# Patient Record
Sex: Male | Born: 1950 | Race: Black or African American | Hispanic: No | Smoking: Former smoker
Health system: Southern US, Community
[De-identification: ages and names within clinical notes are randomized; demographics above are authoritative.]

## PROBLEM LIST (undated history)

## (undated) DIAGNOSIS — M549 Dorsalgia, unspecified: Secondary | ICD-10-CM

## (undated) DIAGNOSIS — G8929 Other chronic pain: Secondary | ICD-10-CM

## (undated) DIAGNOSIS — E785 Hyperlipidemia, unspecified: Secondary | ICD-10-CM

## (undated) HISTORY — DX: Hyperlipidemia, unspecified: E78.5

## (undated) HISTORY — DX: Dorsalgia, unspecified: M54.9

## (undated) HISTORY — DX: Other chronic pain: G89.29

## (undated) HISTORY — PX: OTHER SURGICAL HISTORY: SHX169

---

## 2001-03-05 ENCOUNTER — Emergency Department (HOSPITAL_COMMUNITY): Admission: EM | Admit: 2001-03-05 | Discharge: 2001-03-05 | Payer: Self-pay | Admitting: *Deleted

## 2001-03-23 ENCOUNTER — Encounter: Payer: Self-pay | Admitting: *Deleted

## 2001-03-23 ENCOUNTER — Emergency Department (HOSPITAL_COMMUNITY): Admission: EM | Admit: 2001-03-23 | Discharge: 2001-03-23 | Payer: Self-pay | Admitting: *Deleted

## 2002-01-21 ENCOUNTER — Emergency Department (HOSPITAL_COMMUNITY): Admission: EM | Admit: 2002-01-21 | Discharge: 2002-01-21 | Payer: Self-pay | Admitting: Emergency Medicine

## 2003-08-08 ENCOUNTER — Emergency Department (HOSPITAL_COMMUNITY): Admission: EM | Admit: 2003-08-08 | Discharge: 2003-08-08 | Payer: Self-pay | Admitting: Emergency Medicine

## 2003-09-08 ENCOUNTER — Emergency Department (HOSPITAL_COMMUNITY): Admission: EM | Admit: 2003-09-08 | Discharge: 2003-09-08 | Payer: Self-pay | Admitting: Emergency Medicine

## 2003-10-10 ENCOUNTER — Emergency Department (HOSPITAL_COMMUNITY): Admission: EM | Admit: 2003-10-10 | Discharge: 2003-10-10 | Payer: Self-pay | Admitting: Emergency Medicine

## 2005-02-10 ENCOUNTER — Emergency Department (HOSPITAL_COMMUNITY): Admission: EM | Admit: 2005-02-10 | Discharge: 2005-02-10 | Payer: Self-pay | Admitting: Emergency Medicine

## 2005-06-11 ENCOUNTER — Emergency Department: Payer: Self-pay | Admitting: Emergency Medicine

## 2005-06-15 ENCOUNTER — Ambulatory Visit: Payer: Self-pay | Admitting: Family Medicine

## 2005-07-26 ENCOUNTER — Emergency Department: Payer: Self-pay | Admitting: Emergency Medicine

## 2005-08-01 ENCOUNTER — Emergency Department: Payer: Self-pay | Admitting: Emergency Medicine

## 2005-09-01 ENCOUNTER — Ambulatory Visit: Payer: Self-pay | Admitting: Family Medicine

## 2005-09-04 ENCOUNTER — Ambulatory Visit (HOSPITAL_COMMUNITY): Admission: RE | Admit: 2005-09-04 | Discharge: 2005-09-04 | Payer: Self-pay | Admitting: Family Medicine

## 2006-01-11 ENCOUNTER — Emergency Department (HOSPITAL_COMMUNITY): Admission: EM | Admit: 2006-01-11 | Discharge: 2006-01-11 | Payer: Self-pay | Admitting: Emergency Medicine

## 2006-01-16 ENCOUNTER — Emergency Department (HOSPITAL_COMMUNITY): Admission: EM | Admit: 2006-01-16 | Discharge: 2006-01-16 | Payer: Self-pay | Admitting: Emergency Medicine

## 2006-10-23 ENCOUNTER — Emergency Department (HOSPITAL_COMMUNITY): Admission: EM | Admit: 2006-10-23 | Discharge: 2006-10-23 | Payer: Self-pay | Admitting: Emergency Medicine

## 2006-10-24 ENCOUNTER — Emergency Department (HOSPITAL_COMMUNITY): Admission: EM | Admit: 2006-10-24 | Discharge: 2006-10-24 | Payer: Self-pay | Admitting: Emergency Medicine

## 2007-01-18 ENCOUNTER — Emergency Department (HOSPITAL_COMMUNITY): Admission: EM | Admit: 2007-01-18 | Discharge: 2007-01-18 | Payer: Self-pay | Admitting: Emergency Medicine

## 2007-02-17 ENCOUNTER — Emergency Department (HOSPITAL_COMMUNITY): Admission: EM | Admit: 2007-02-17 | Discharge: 2007-02-17 | Payer: Self-pay | Admitting: Emergency Medicine

## 2007-02-20 ENCOUNTER — Emergency Department (HOSPITAL_COMMUNITY): Admission: EM | Admit: 2007-02-20 | Discharge: 2007-02-20 | Payer: Self-pay | Admitting: Emergency Medicine

## 2007-05-05 ENCOUNTER — Emergency Department (HOSPITAL_COMMUNITY): Admission: EM | Admit: 2007-05-05 | Discharge: 2007-05-05 | Payer: Self-pay | Admitting: Emergency Medicine

## 2007-07-11 ENCOUNTER — Encounter: Payer: Self-pay | Admitting: Family Medicine

## 2007-08-16 ENCOUNTER — Emergency Department (HOSPITAL_COMMUNITY): Admission: EM | Admit: 2007-08-16 | Discharge: 2007-08-16 | Payer: Self-pay | Admitting: Emergency Medicine

## 2007-09-04 ENCOUNTER — Emergency Department (HOSPITAL_COMMUNITY): Admission: EM | Admit: 2007-09-04 | Discharge: 2007-09-04 | Payer: Self-pay | Admitting: Emergency Medicine

## 2008-01-06 ENCOUNTER — Emergency Department (HOSPITAL_COMMUNITY): Admission: EM | Admit: 2008-01-06 | Discharge: 2008-01-06 | Payer: Self-pay | Admitting: Emergency Medicine

## 2008-04-24 ENCOUNTER — Emergency Department (HOSPITAL_COMMUNITY): Admission: EM | Admit: 2008-04-24 | Discharge: 2008-04-24 | Payer: Self-pay | Admitting: Emergency Medicine

## 2009-05-05 ENCOUNTER — Encounter: Payer: Self-pay | Admitting: Internal Medicine

## 2009-05-13 ENCOUNTER — Emergency Department (HOSPITAL_COMMUNITY): Admission: EM | Admit: 2009-05-13 | Discharge: 2009-05-13 | Payer: Self-pay | Admitting: Emergency Medicine

## 2009-06-17 ENCOUNTER — Encounter: Payer: Self-pay | Admitting: Orthopedic Surgery

## 2009-10-20 ENCOUNTER — Encounter: Admission: RE | Admit: 2009-10-20 | Discharge: 2009-10-20 | Payer: Self-pay | Admitting: Orthopedic Surgery

## 2010-07-30 ENCOUNTER — Encounter: Payer: Self-pay | Admitting: Family Medicine

## 2010-07-31 ENCOUNTER — Encounter: Payer: Self-pay | Admitting: Orthopedic Surgery

## 2010-08-09 NOTE — Letter (Signed)
Summary: Historic Patient File  Historic Patient File   Imported By: Lind Guest 04/06/2010 11:16:20  _____________________________________________________________________  External Attachment:    Type:   Image     Comment:   External Document

## 2010-09-07 ENCOUNTER — Emergency Department (HOSPITAL_COMMUNITY)
Admission: EM | Admit: 2010-09-07 | Discharge: 2010-09-08 | Disposition: A | Discharge status: Home / Self Care | Payer: Self-pay | Attending: Emergency Medicine | Admitting: Emergency Medicine

## 2010-09-07 ENCOUNTER — Emergency Department (HOSPITAL_COMMUNITY): Payer: Self-pay

## 2010-09-07 DIAGNOSIS — W010XXA Fall on same level from slipping, tripping and stumbling without subsequent striking against object, initial encounter: Secondary | ICD-10-CM | POA: Insufficient documentation

## 2010-09-07 DIAGNOSIS — S51009A Unspecified open wound of unspecified elbow, initial encounter: Secondary | ICD-10-CM | POA: Insufficient documentation

## 2010-09-23 ENCOUNTER — Emergency Department (HOSPITAL_COMMUNITY)
Admission: EM | Admit: 2010-09-23 | Discharge: 2010-09-23 | Disposition: A | Discharge status: Home / Self Care | Payer: Self-pay | Attending: Emergency Medicine | Admitting: Emergency Medicine

## 2010-09-23 DIAGNOSIS — T6391XA Toxic effect of contact with unspecified venomous animal, accidental (unintentional), initial encounter: Secondary | ICD-10-CM | POA: Insufficient documentation

## 2010-09-23 DIAGNOSIS — Y92009 Unspecified place in unspecified non-institutional (private) residence as the place of occurrence of the external cause: Secondary | ICD-10-CM | POA: Insufficient documentation

## 2010-09-23 DIAGNOSIS — T63391A Toxic effect of venom of other spider, accidental (unintentional), initial encounter: Secondary | ICD-10-CM | POA: Insufficient documentation

## 2010-10-12 LAB — CULTURE, ROUTINE-ABSCESS

## 2011-02-14 ENCOUNTER — Encounter: Payer: Self-pay | Admitting: Family Medicine

## 2011-02-15 ENCOUNTER — Encounter: Payer: Self-pay | Admitting: Family Medicine

## 2011-02-15 ENCOUNTER — Ambulatory Visit (INDEPENDENT_AMBULATORY_CARE_PROVIDER_SITE_OTHER): Payer: No Typology Code available for payment source | Admitting: Family Medicine

## 2011-02-15 VITALS — BP 106/70 | HR 55 | Ht 72.0 in | Wt 147.1 lb

## 2011-02-15 DIAGNOSIS — J45909 Unspecified asthma, uncomplicated: Secondary | ICD-10-CM

## 2011-02-15 DIAGNOSIS — M549 Dorsalgia, unspecified: Secondary | ICD-10-CM

## 2011-02-15 DIAGNOSIS — N529 Male erectile dysfunction, unspecified: Secondary | ICD-10-CM

## 2011-02-15 DIAGNOSIS — Z833 Family history of diabetes mellitus: Secondary | ICD-10-CM

## 2011-02-15 MED ORDER — GABAPENTIN 300 MG PO CAPS: 300.0000 mg | ORAL_CAPSULE | Freq: Every evening | ORAL | Status: DC | PRN

## 2011-02-15 MED ORDER — ALBUTEROL 90 MCG/ACT IN AERS: 2.0000 | INHALATION_SPRAY | RESPIRATORY_TRACT | Status: DC | PRN

## 2011-02-15 MED ORDER — TRAMADOL HCL 50 MG PO TABS: 50.0000 mg | ORAL_TABLET | Freq: Three times a day (TID) | ORAL | Status: DC | PRN

## 2011-02-15 NOTE — Patient Instructions (Signed)
Start the neurontin at bedtime for your tingling and numbness Use the ultram/Tramadol for pain Make a follow-up visit in 3 weeks to see how the medication is doing and to discuss your labs You can not eat after midnight prior to having your labs drawn

## 2011-02-15 NOTE — Assessment & Plan Note (Signed)
I will obtain records from his previous neurosurgeon. I am able to see where patient was getting epidural injections secondary to his lumbar back pain and radiculopathy. I will start him today on tramadol when necessary. He will eventually need to be referred to another neurosurgeon for any intervention. He will also start gabapentin for the neuropathy

## 2011-02-15 NOTE — Assessment & Plan Note (Signed)
Albuterol inhaler given
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
## 2011-02-15 NOTE — Assessment & Plan Note (Signed)
Patient's significant family history for diabetes and heart disease. He's not seen her primary care doctor has not had any labs done. I will obtain basic labs on him today.

## 2011-03-07 LAB — COMPREHENSIVE METABOLIC PANEL
Albumin: 4.6 g/dL (ref 3.5–5.2)
Alkaline Phosphatase: 50 U/L (ref 39–117)
Glucose, Bld: 114 mg/dL — ABNORMAL HIGH (ref 70–99)
Potassium: 4.8 mEq/L (ref 3.5–5.3)
Sodium: 143 mEq/L (ref 135–145)
Total Bilirubin: 0.6 mg/dL (ref 0.3–1.2)
Total Protein: 6.8 g/dL (ref 6.0–8.3)

## 2011-03-07 LAB — LIPID PANEL
Cholesterol: 214 mg/dL — ABNORMAL HIGH (ref 0–200)
LDL Cholesterol: 134 mg/dL — ABNORMAL HIGH (ref 0–99)
Total CHOL/HDL Ratio: 3.2 Ratio
Triglycerides: 71 mg/dL (ref <150)
VLDL: 14 mg/dL (ref 0–40)

## 2011-03-07 LAB — TESTOSTERONE, FREE, TOTAL, SHBG: Testosterone, Free: 98.2 pg/mL (ref 47.0–244.0)

## 2011-03-09 ENCOUNTER — Encounter: Payer: Self-pay | Admitting: Family Medicine

## 2011-03-09 ENCOUNTER — Telehealth: Payer: Self-pay | Admitting: Family Medicine

## 2011-03-09 ENCOUNTER — Ambulatory Visit (INDEPENDENT_AMBULATORY_CARE_PROVIDER_SITE_OTHER): Payer: No Typology Code available for payment source | Admitting: Family Medicine

## 2011-03-09 VITALS — BP 120/78 | HR 59 | Resp 16 | Ht 71.5 in | Wt 146.4 lb

## 2011-03-09 DIAGNOSIS — R7309 Other abnormal glucose: Secondary | ICD-10-CM | POA: Insufficient documentation

## 2011-03-09 DIAGNOSIS — N529 Male erectile dysfunction, unspecified: Secondary | ICD-10-CM

## 2011-03-09 DIAGNOSIS — Z125 Encounter for screening for malignant neoplasm of prostate: Secondary | ICD-10-CM

## 2011-03-09 DIAGNOSIS — M549 Dorsalgia, unspecified: Secondary | ICD-10-CM

## 2011-03-09 MED ORDER — TRAMADOL HCL 50 MG PO TABS: 50.0000 mg | ORAL_TABLET | Freq: Three times a day (TID) | ORAL | Status: DC | PRN

## 2011-03-09 MED ORDER — CYCLOBENZAPRINE HCL 10 MG PO TABS: ORAL_TABLET | ORAL | Status: DC

## 2011-03-09 MED ORDER — SILDENAFIL CITRATE 50 MG PO TABS: 50.0000 mg | ORAL_TABLET | ORAL | Status: DC | PRN

## 2011-03-09 MED ORDER — GABAPENTIN 300 MG PO CAPS: 300.0000 mg | ORAL_CAPSULE | Freq: Three times a day (TID) | ORAL | Status: DC

## 2011-03-09 NOTE — Assessment & Plan Note (Signed)
I have not received any information from his neurosurgeon. Request will be sent again. I will increase his tramadol to 1-2 tablets as needed. I will also increase his Neurontin based on the taper given the patient instructions. Goal of one tablet 3 times a day. For short term I will give him a muscle relaxant.

## 2011-03-09 NOTE — Assessment & Plan Note (Signed)
Will obtain hemoglobin A1c based on abnormal glucose as well as family history

## 2011-03-09 NOTE — Patient Instructions (Signed)
For your back, use the flexeril (muscle spasm) medication at bedtime as needed  Increase the ultram to 1-2 tablets as needed for pain For the neurontin- take 1 tablet in the morning and 1 in the evening x 1 week, then increase to 1 tablet three times a day I will check labs Recheck in 6 weeks
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
## 2011-03-09 NOTE — Telephone Encounter (Signed)
Please call his pharmacy, all of these meds are covered by Medicaid and find out which one is the problem. He was able to get them the last visit

## 2011-03-09 NOTE — Telephone Encounter (Signed)
Viagra not covered.

## 2011-03-09 NOTE — Assessment & Plan Note (Signed)
Currently has no chronic comorbidities. His testosterone level was normal. I discussed PSA with him we'll check this as well. He will given a trial  of Viagra

## 2011-03-10 ENCOUNTER — Telehealth: Payer: Self-pay | Admitting: Family Medicine

## 2011-03-10 MED ORDER — TADALAFIL 10 MG PO TABS: 10.0000 mg | ORAL_TABLET | Freq: Every day | ORAL | Status: AC | PRN

## 2011-03-10 NOTE — Telephone Encounter (Signed)
I spoke with pt , he is unable to afford the viagra. He will come get a coupn book for free 30 day trial for Cialis. Instructed to take two 5mg  tablets .I will also give a script. I advised medicaid does not cover these, they are apprx $20 a pill.

## 2011-03-31 LAB — CBC
Hemoglobin: 15
MCHC: 33.8
Platelets: 292
RBC: 5.25
RDW: 14.2

## 2011-03-31 LAB — URINALYSIS, ROUTINE W REFLEX MICROSCOPIC
Nitrite: NEGATIVE
Protein, ur: NEGATIVE
pH: 5.5

## 2011-03-31 LAB — DIFFERENTIAL
Eosinophils Absolute: 0.3
Eosinophils Relative: 6 — ABNORMAL HIGH
Lymphocytes Relative: 44
Lymphs Abs: 1.9
Monocytes Absolute: 0.4

## 2011-03-31 LAB — URINE MICROSCOPIC-ADD ON

## 2011-03-31 LAB — BASIC METABOLIC PANEL
Calcium: 9.5
Chloride: 103
Creatinine, Ser: 1.07
Sodium: 138

## 2011-04-19 LAB — CULTURE, ROUTINE-ABSCESS: Gram Stain: NONE SEEN

## 2011-04-20 ENCOUNTER — Ambulatory Visit: Payer: Medicaid Other | Admitting: Family Medicine

## 2011-04-28 ENCOUNTER — Encounter: Payer: Self-pay | Admitting: Family Medicine

## 2011-04-28 ENCOUNTER — Ambulatory Visit (INDEPENDENT_AMBULATORY_CARE_PROVIDER_SITE_OTHER): Payer: Medicaid Other | Admitting: Family Medicine

## 2011-04-28 VITALS — BP 116/72 | HR 85 | Resp 16 | Ht 71.5 in | Wt 142.0 lb

## 2011-04-28 DIAGNOSIS — R1013 Epigastric pain: Secondary | ICD-10-CM

## 2011-04-28 DIAGNOSIS — K3189 Other diseases of stomach and duodenum: Secondary | ICD-10-CM

## 2011-04-28 DIAGNOSIS — N529 Male erectile dysfunction, unspecified: Secondary | ICD-10-CM

## 2011-04-28 DIAGNOSIS — M549 Dorsalgia, unspecified: Secondary | ICD-10-CM

## 2011-04-28 MED ORDER — SILDENAFIL CITRATE 50 MG PO TABS: 50.0000 mg | ORAL_TABLET | ORAL | Status: DC | PRN

## 2011-04-28 MED ORDER — HYDROCODONE-ACETAMINOPHEN 5-500 MG PO TABS: 1.0000 | ORAL_TABLET | Freq: Four times a day (QID) | ORAL | Status: DC | PRN

## 2011-04-28 MED ORDER — RANITIDINE HCL 150 MG PO TABS: 150.0000 mg | ORAL_TABLET | Freq: Every day | ORAL | Status: DC

## 2011-04-28 NOTE — Progress Notes (Signed)
  Subjective:    Patient ID: Charles Rivers, male    DOB: 01/21/1951, 60 y.o.   MRN: [id number]  HPI   Received letter from [employer] that states his disability has been approved - pt brought to show me Continues to have back pain with radiation down right side Neurontin makes him sleepy but does not help with pain Not using flexeril very much Ultram not helping   Asthma- doing well, did not want to get flu shot, used the inhaler only once since last visit, no URI symptoms currently   Nausea and upset stomach- on and off, better with meals, has reflux at times, uses baking soda and water and this relieves it.  ED- Cialis didn't work, would like 3 tabs of the viagra  Review of Systems        GEN- No fever, no fatigue         CVS-no CP         RESP- no SOB, no cough         ABD- per above         Neuro- +radicular symptoms    Objective:   Physical Exam GEN-NAD, alert and oriented, thin  CVS-RRR, no murmur RESP-CTAB ABD- NABS, soft, NT, ND, no masses       Assessment & Plan:

## 2011-04-28 NOTE — Patient Instructions (Signed)
Start the vicodin as needed for severe pain  Continue the neurontin You can hold on the muscle relaxant  Get the labs done when you can  Next visit in 4 months

## 2011-04-30 DIAGNOSIS — R1013 Epigastric pain: Secondary | ICD-10-CM | POA: Insufficient documentation

## 2011-04-30 NOTE — Assessment & Plan Note (Signed)
Pt still needs to get PSA, given 3 tablets of Viagra

## 2011-04-30 NOTE — Assessment & Plan Note (Signed)
Continue neurontin, will give hydrocodone for severe pain, this will last monthly.

## 2011-04-30 NOTE — Assessment & Plan Note (Signed)
Trial of H2 blocker

## 2011-06-07 ENCOUNTER — Encounter: Payer: Self-pay | Admitting: Family Medicine

## 2011-06-09 ENCOUNTER — Encounter: Payer: Self-pay | Admitting: Family Medicine

## 2011-06-09 ENCOUNTER — Ambulatory Visit (INDEPENDENT_AMBULATORY_CARE_PROVIDER_SITE_OTHER): Payer: Medicaid Other | Admitting: Family Medicine

## 2011-06-09 ENCOUNTER — Telehealth: Payer: Self-pay | Admitting: Family Medicine

## 2011-06-09 VITALS — BP 102/62 | HR 60 | Resp 18 | Ht 71.5 in | Wt 140.0 lb

## 2011-06-09 DIAGNOSIS — M549 Dorsalgia, unspecified: Secondary | ICD-10-CM

## 2011-06-09 DIAGNOSIS — R7301 Impaired fasting glucose: Secondary | ICD-10-CM

## 2011-06-09 DIAGNOSIS — Z833 Family history of diabetes mellitus: Secondary | ICD-10-CM

## 2011-06-09 DIAGNOSIS — J45909 Unspecified asthma, uncomplicated: Secondary | ICD-10-CM

## 2011-06-09 MED ORDER — PREDNISONE (PAK) 5 MG PO TABS: 5.0000 mg | ORAL_TABLET | ORAL | Status: DC

## 2011-06-09 MED ORDER — HYDROCODONE-ACETAMINOPHEN 5-500 MG PO TABS: ORAL_TABLET | ORAL | Status: DC

## 2011-06-09 MED ORDER — HYDROCODONE-ACETAMINOPHEN 5-500 MG PO TABS: ORAL_TABLET | ORAL | Status: AC

## 2011-06-09 NOTE — Progress Notes (Signed)
  Subjective:    Patient ID: Charles Rivers, male    DOB: 1951/06/02, 60 y.o.   MRN: [id number]  HPI 3 day h/o increased low back pain, radiating down right lower extremity, states started  After lifting his 59 year old daughter. He has established disc disease, and reports intermittent numbness and weakness in right lower extremity. Still contemplating back surgery. Will take prednisone dose pack, but no interest in referal for epidural or depo medrol in the office. Denies any cough , chest congestion or wheeze currently, refusing flu vaccine   Review of Systems See HPI Denies recent fever or chills. Denies sinus pressure, nasal congestion, ear pain or sore throat. Denies chest congestion, productive cough or wheezing. Denies chest pains, palpitations and leg swelling Denies abdominal pain, nausea, vomiting,diarrhea or constipation.   Denies dysuria, frequency, hesitancy or incontinence.  Denies headaches, seizures, numbness, or tingling. Denies depression, anxiety or insomnia. Denies skin break down or rash.       Objective:   Physical Exam Patient alert and oriented and in no cardiopulmonary distress.  HEENT: No facial asymmetry, EOMI, no sinus tenderness,  oropharynx pink and moist.  Neck supple no adenopathy.  Chest: Clear to auscultation bilaterally.Decreased air entry throughout  CVS: S1, S2 no murmurs, no S3.  ABD: Soft non tender. Bowel sounds normal.  Ext: No edema  MS: decreased  ROM spine,adequate in shoulders, hips and knees.  Skin: Intact, no ulcerations or rash noted.  Psych: Good eye contact, normal affect. Memory intact not anxious or depressed appearing.  CNS: CN 2-12 intact, power, tone and sensation normal throughout.        Assessment & Plan:

## 2011-06-09 NOTE — Assessment & Plan Note (Signed)
Increased and uncontrolled x 3 days.p rednisone dose pack , refill x1 on vicodin , encouraged pt to reconsider the value of surgery

## 2011-06-09 NOTE — Assessment & Plan Note (Signed)
Elevated fasting blood glucose in the past, will check hBA1C

## 2011-06-09 NOTE — Assessment & Plan Note (Signed)
Stable refuse flu vaccine

## 2011-06-09 NOTE — Patient Instructions (Signed)
F/u  With dr Jeanice Lim as before.  You need a flu vaccine.  HBA1C today.  Please consider the need for back surgery again, in any event you need to be followed by a pain clinic

## 2011-06-09 NOTE — Telephone Encounter (Signed)
Called [facility] And they stated that they did receive the rx and are working on filling it.  Called [name] and left message letting him know that the pharmacy is working on his rx.

## 2011-07-05 ENCOUNTER — Emergency Department (HOSPITAL_COMMUNITY): Payer: No Typology Code available for payment source

## 2011-07-05 ENCOUNTER — Encounter (HOSPITAL_COMMUNITY): Payer: Self-pay | Admitting: *Deleted

## 2011-07-05 ENCOUNTER — Emergency Department (HOSPITAL_COMMUNITY)
Admission: EM | Admit: 2011-07-05 | Discharge: 2011-07-05 | Disposition: A | Discharge status: Home / Self Care | Payer: No Typology Code available for payment source | Attending: Emergency Medicine | Admitting: Emergency Medicine

## 2011-07-05 DIAGNOSIS — J45909 Unspecified asthma, uncomplicated: Secondary | ICD-10-CM | POA: Insufficient documentation

## 2011-07-05 DIAGNOSIS — S335XXA Sprain of ligaments of lumbar spine, initial encounter: Secondary | ICD-10-CM | POA: Insufficient documentation

## 2011-07-05 DIAGNOSIS — S39012A Strain of muscle, fascia and tendon of lower back, initial encounter: Secondary | ICD-10-CM

## 2011-07-05 DIAGNOSIS — M549 Dorsalgia, unspecified: Secondary | ICD-10-CM | POA: Insufficient documentation

## 2011-07-05 DIAGNOSIS — M25569 Pain in unspecified knee: Secondary | ICD-10-CM | POA: Insufficient documentation

## 2011-07-05 DIAGNOSIS — S20219A Contusion of unspecified front wall of thorax, initial encounter: Secondary | ICD-10-CM

## 2011-07-05 MED ORDER — HYDROCODONE-ACETAMINOPHEN 5-325 MG PO TABS: 1.0000 | ORAL_TABLET | Freq: Four times a day (QID) | ORAL | Status: AC | PRN
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
## 2011-07-08 ENCOUNTER — Encounter (HOSPITAL_COMMUNITY): Payer: Self-pay

## 2011-07-08 ENCOUNTER — Emergency Department (HOSPITAL_COMMUNITY)
Admission: EM | Admit: 2011-07-08 | Discharge: 2011-07-08 | Discharge status: Against Medical Advice | Payer: No Typology Code available for payment source | Attending: Emergency Medicine | Admitting: Emergency Medicine

## 2011-07-08 DIAGNOSIS — R51 Headache: Secondary | ICD-10-CM | POA: Insufficient documentation

## 2011-07-08 DIAGNOSIS — Z79899 Other long term (current) drug therapy: Secondary | ICD-10-CM | POA: Insufficient documentation

## 2011-07-08 DIAGNOSIS — J45909 Unspecified asthma, uncomplicated: Secondary | ICD-10-CM | POA: Insufficient documentation

## 2011-07-08 NOTE — ED Notes (Signed)
Pt NO ANSWER X2

## 2011-07-08 NOTE — ED Notes (Signed)
Pt NO ANSWER X 3
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
## 2011-07-08 NOTE — ED Notes (Signed)
Pt not in waiting room

## 2011-07-10 ENCOUNTER — Telehealth: Payer: Self-pay
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
## 2011-07-10 NOTE — Telephone Encounter (Signed)
pls sched first AVAILABLE appt either with myself or preferably his pCP , Dr Jeanice Lim. If he is having new  Or worsening problems since his first ED visit , pls also advise him to return to the ED for new or worsening concerns

## 2011-07-13 NOTE — Telephone Encounter (Signed)
Called pt and got no answer 

## 2011-07-14 ENCOUNTER — Other Ambulatory Visit (HOSPITAL_COMMUNITY): Payer: Self-pay | Admitting: Chiropractic Medicine

## 2011-07-14 ENCOUNTER — Ambulatory Visit (HOSPITAL_COMMUNITY)
Admission: RE | Admit: 2011-07-14 | Discharge: 2011-07-14 | Disposition: A | Discharge status: Home / Self Care | Payer: No Typology Code available for payment source | Source: Ambulatory Visit | Attending: Chiropractic Medicine | Admitting: Chiropractic Medicine

## 2011-07-14 DIAGNOSIS — R51 Headache: Secondary | ICD-10-CM | POA: Insufficient documentation

## 2011-07-14 DIAGNOSIS — H5711 Ocular pain, right eye: Secondary | ICD-10-CM

## 2011-08-15 ENCOUNTER — Telehealth: Payer: Self-pay | Admitting: Family Medicine

## 2011-08-17 NOTE — Telephone Encounter (Signed)
Needs ov with pcp dr Jeanice Lim

## 2011-08-17 NOTE — Telephone Encounter (Signed)
On legs and shoulders, dry and itchy like a rash. Been there x 1 week. It goes and comes. Wants to know if he can get a cream sent in for it

## 2011-08-18 NOTE — Telephone Encounter (Signed)
Patient aware and luann to schedule

## 2011-08-18 NOTE — Telephone Encounter (Signed)
Patient aware. Luann to schedule

## 2011-08-21 ENCOUNTER — Encounter: Payer: Self-pay | Admitting: Family Medicine

## 2011-08-21 ENCOUNTER — Ambulatory Visit (INDEPENDENT_AMBULATORY_CARE_PROVIDER_SITE_OTHER): Payer: Medicaid Other | Admitting: Family Medicine

## 2011-08-21 VITALS — BP 118/70 | HR 59 | Resp 18 | Ht 71.5 in | Wt 142.1 lb

## 2011-08-21 DIAGNOSIS — J45909 Unspecified asthma, uncomplicated: Secondary | ICD-10-CM

## 2011-08-21 DIAGNOSIS — D179 Benign lipomatous neoplasm, unspecified: Secondary | ICD-10-CM

## 2011-08-21 DIAGNOSIS — M549 Dorsalgia, unspecified: Secondary | ICD-10-CM

## 2011-08-21 DIAGNOSIS — Z1211 Encounter for screening for malignant neoplasm of colon: Secondary | ICD-10-CM

## 2011-08-21 DIAGNOSIS — H5710 Ocular pain, unspecified eye: Secondary | ICD-10-CM

## 2011-08-21 DIAGNOSIS — R21 Rash and other nonspecific skin eruption: Secondary | ICD-10-CM

## 2011-08-21 MED ORDER — HYDROCODONE-ACETAMINOPHEN 7.5-500 MG PO TABS: 1.0000 | ORAL_TABLET | Freq: Three times a day (TID) | ORAL | Status: AC | PRN

## 2011-08-21 MED ORDER — PERMETHRIN 5 % EX CREA: TOPICAL_CREAM | CUTANEOUS | Status: DC

## 2011-08-21 MED ORDER — GABAPENTIN 300 MG PO CAPS: 300.0000 mg | ORAL_CAPSULE | Freq: Three times a day (TID) | ORAL | Status: DC

## 2011-08-21 NOTE — Patient Instructions (Signed)
I will refer you for a colonoscopy I will refer you to the eye doctor  I will refer you for a consult with neurosurgery about options for back Keep an eye on the lump on the side of your head  For your back, restart the neurontin- take 300mg  at bedtime, you can increase to 300mg  twice a day after 1 week, then 3 times a day  Hydrocodone for severe back pain Continue your asthma inhaler  Use the cream as directed, was all linen in hot water. You can repeat the cream in 2 weeks if not better and moisturize F/U 6 months

## 2011-08-22 ENCOUNTER — Encounter: Payer: Self-pay | Admitting: Family Medicine

## 2011-08-22 DIAGNOSIS — H5710 Ocular pain, unspecified eye: Secondary | ICD-10-CM | POA: Insufficient documentation

## 2011-08-22 DIAGNOSIS — R21 Rash and other nonspecific skin eruption: Secondary | ICD-10-CM | POA: Insufficient documentation

## 2011-08-22 DIAGNOSIS — D179 Benign lipomatous neoplasm, unspecified: Secondary | ICD-10-CM | POA: Insufficient documentation
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
## 2011-08-22 NOTE — Assessment & Plan Note (Signed)
Stable, no change in meds.   

## 2011-08-22 NOTE — Assessment & Plan Note (Signed)
Worsening back pain. Patient will restart Neurontin for radiculopathy. Hydrocodone refills. He would like to be referred back to neurosurgery for another consult.

## 2011-08-22 NOTE — Assessment & Plan Note (Signed)
Will treat for scabies and bed bugs based on history.

## 2011-08-22 NOTE — Assessment & Plan Note (Signed)
CT of head more suggestive of lipoma, other diff, hematoma, will monitor, pt given red flags,if needed surgical referral for removal
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
## 2011-08-23 ENCOUNTER — Telehealth: Payer: Self-pay

## 2011-08-23 NOTE — Telephone Encounter (Signed)
Pt referred from Dr. Jeanice Lim for screening colonoscopy.

## 2011-08-29 NOTE — Telephone Encounter (Signed)
Attempted to call pt. Phone number of [phone] is temporarily disconnected. Mailing a letter for pt to call.

## 2011-09-01 ENCOUNTER — Ambulatory Visit: Payer: Medicaid Other | Admitting: Family Medicine

## 2011-09-06 ENCOUNTER — Telehealth: Payer: Self-pay

## 2011-09-06 ENCOUNTER — Other Ambulatory Visit: Payer: Self-pay

## 2011-09-06 DIAGNOSIS — Z139 Encounter for screening, unspecified: Secondary | ICD-10-CM

## 2011-09-06 NOTE — Telephone Encounter (Signed)
Gastroenterology Pre-Procedure Form   Request Date: 09/06/2011      Requesting Physician: Dr. Jeanice Lim     PATIENT INFORMATION:  Charles Rivers is a 61 y.o., male (DOB=14-Jan-1951).  PROCEDURE: Procedure(s) requested: colonoscopy Procedure Reason: screening for colon cancer  PATIENT REVIEW QUESTIONS: The patient reports the following:   1. Diabetes Melitis: no 2. Joint replacements in the past 12 months: no 3. Major health problems in the past 3 months: no 4. Has an artificial valve or MVP:no 5. Has been advised in past to take antibiotics in advance of a procedure like teeth cleaning: no}    MEDICATIONS & ALLERGIES:    Patient reports the following regarding taking any blood thinners:   Plavix? no Aspirin?no Coumadin?  no  Patient confirms/reports the following medications:  Current Outpatient Prescriptions  Medication Sig Dispense Refill  . albuterol (PROVENTIL,VENTOLIN) 90 MCG/ACT inhaler Inhale 2 puffs into the lungs every 4 (four) hours as needed for wheezing.  17 g  3  . cyclobenzaprine (FLEXERIL) 10 MG tablet 1 tablet at bedtime  30 tablet  0  . gabapentin (NEURONTIN) 300 MG capsule Take 1 capsule (300 mg total) by mouth 3 (three) times daily.  90 capsule  1  . HYDROcodone-acetaminophen (LORTAB) 7.5-500 MG per tablet Take 1 tablet by mouth every 6 (six) hours as needed. Doesn't take as prescribed every day/  Some days none, and some days 3-4 tablets      . Vardenafil HCl (LEVITRA PO) Take 1 tablet by mouth as needed. For intercourse       . permethrin (ELIMITE) 5 % cream Apply from neck down, leave on for 8 hours then rinse, may repeat in 2 weeks  60 g  1    Patient confirms/reports the following allergies:  No Known Allergies  Patient is appropriate to schedule for requested procedure(s): yes  AUTHORIZATION INFORMATION Primary Insurance:   ID #:   Group #:  Pre-Cert / Auth required: Pre-Cert / Auth #:   Secondary Insurance:   ID #:  Group #:  Pre-Cert / Auth  required:  Pre-Cert / Auth #:   No orders of the defined types were placed in this encounter.    SCHEDULE INFORMATION: Procedure has been scheduled as follows:  Date: 09/26/2011        Time: 10:30 AM Location: [facility]  This Gastroenterology Pre-Precedure Form is being routed to the following provider(s) for review: Jonette Eva, MD

## 2011-09-06 NOTE — Telephone Encounter (Signed)
MOVI PREP SPLIT DOSING, REGULAR BREAKFAST. CLEAR LIQUIDS AFTER 9 AM.  

## 2011-09-06 NOTE — Telephone Encounter (Signed)
Rx and instructions mailed to pt.  

## 2011-09-25 MED ORDER — SODIUM CHLORIDE 0.45 % IV SOLN: Freq: Once | INTRAVENOUS | Status: DC

## 2011-09-26 ENCOUNTER — Telehealth: Payer: Self-pay

## 2011-09-26 ENCOUNTER — Other Ambulatory Visit: Payer: Self-pay | Admitting: Family Medicine

## 2011-09-26 ENCOUNTER — Ambulatory Visit (HOSPITAL_COMMUNITY): Admission: RE | Admit: 2011-09-26 | Payer: Medicaid Other | Source: Ambulatory Visit | Admitting: Gastroenterology

## 2011-09-26 SURGERY — COLONOSCOPY
Anesthesia: Moderate Sedation

## 2011-09-26 NOTE — Telephone Encounter (Signed)
Per Soledad Gerlach, Melanie called and said that pt was a no show this morning for his colonoscopy. I tried to call his number and could not get through. I called his mom, emergency contact. She said she will tell him if she see's him today to call me. She had me call back and leave message on VM with phone number to call.

## 2011-09-28 NOTE — Telephone Encounter (Signed)
Letter sent to pt to call and reschedule his appointment for colonoscopy.

## 2011-10-02 NOTE — Telephone Encounter (Signed)
Do you want to refill vicodin?

## 2011-10-02 NOTE — Telephone Encounter (Signed)
You can refill this  

## 2011-10-12 ENCOUNTER — Telehealth: Payer: Self-pay

## 2011-10-12 NOTE — Telephone Encounter (Signed)
LMOM to call and reschedule colonoscopy that was scheduled in March. ( Letter was returned that was mailed to [address] in [place].

## 2011-10-24 ENCOUNTER — Telehealth: Payer: Self-pay

## 2011-10-24 NOTE — Telephone Encounter (Signed)
See phone note of 10/24/2011.

## 2011-10-24 NOTE — Telephone Encounter (Signed)
Called the [phone] phone number and was told I had the wrong number. I will remove it from the chart. Will mail a letter for pt to call at the [facility] [address] address.

## 2012-02-20 ENCOUNTER — Ambulatory Visit (INDEPENDENT_AMBULATORY_CARE_PROVIDER_SITE_OTHER): Payer: Self-pay | Admitting: Family Medicine

## 2012-02-20 ENCOUNTER — Encounter: Payer: Self-pay | Admitting: Family Medicine

## 2012-02-20 VITALS — BP 118/82 | HR 63 | Resp 16 | Ht 71.5 in | Wt 136.4 lb

## 2012-02-20 DIAGNOSIS — M549 Dorsalgia, unspecified: Secondary | ICD-10-CM

## 2012-02-20 DIAGNOSIS — J45909 Unspecified asthma, uncomplicated: Secondary | ICD-10-CM

## 2012-02-20 MED ORDER — GABAPENTIN 300 MG PO CAPS: 300.0000 mg | ORAL_CAPSULE | Freq: Three times a day (TID) | ORAL | Status: DC

## 2012-02-20 MED ORDER — HYDROCODONE-ACETAMINOPHEN 5-500 MG PO TABS: ORAL_TABLET | ORAL | Status: DC

## 2012-02-20 NOTE — Progress Notes (Signed)
  Subjective:    Patient ID: Charles Rivers, male    DOB: 05-Aug-1950, 61 y.o.   MRN: [id number]  HPI  Pt here to f/u chronic back pain and asthma, no recent use of inahler doing well  Back pain increased since last visit out of meds x 2 months , lost insurance unable to go to neurosurgery appt, no change in bowel or bladder, continued radicular symptoms   Review of Systems     GEN- denies fatigue, fever, weight loss,weakness, recent illness HEENT- denies eye drainage, change in vision, nasal discharge, CVS- denies chest pain, palpitations RESP- denies SOB, cough, wheeze ABD- denies N/V, change in stools, abd pain GU- denies dysuria, hematuria, dribbling, incontinence MSK- + joint pain, muscle aches, injury Neuro- denies headache, dizziness, syncope, seizure activity   Objective:   Physical Exam GEN- NAD, alert and oriented x3 HEENT- PERRL, EOMI, non injected sclera, pink conjunctiva, MMM, oropharynx clear CVS- RRR, no murmur RESP-CTAB EXT- No edema Spine- TTP lumbar, +SLR,  Pulses- Radial, DP- 2+        Assessment & Plan:

## 2012-02-20 NOTE — Patient Instructions (Signed)
Restart gabapentin and vicodin as needed If unable to afford please call and we will try amitryptiline for the nerve pain  Call when you insurance is re-established then we can put the referral back to the neurosurgery  F/U 5 months

## 2012-02-21 ENCOUNTER — Other Ambulatory Visit: Payer: Self-pay | Admitting: Family Medicine

## 2012-02-21 NOTE — Assessment & Plan Note (Signed)
Deteriorated off meds, needs to see specialist once insurance established, refilled hydrocodne and neurontin

## 2012-02-21 NOTE — Assessment & Plan Note (Signed)
Currently stable, albuterol as needed

## 2012-03-08 ENCOUNTER — Ambulatory Visit (INDEPENDENT_AMBULATORY_CARE_PROVIDER_SITE_OTHER): Payer: Self-pay | Admitting: Family Medicine

## 2012-03-08 ENCOUNTER — Encounter: Payer: Self-pay | Admitting: Family Medicine

## 2012-03-08 VITALS — BP 122/76 | HR 76 | Resp 18 | Wt 131.1 lb

## 2012-03-08 DIAGNOSIS — M549 Dorsalgia, unspecified: Secondary | ICD-10-CM

## 2012-03-08 MED ORDER — PREDNISONE (PAK) 5 MG PO TABS: 5.0000 mg | ORAL_TABLET | ORAL | Status: DC

## 2012-03-08 MED ORDER — METHYLPREDNISOLONE ACETATE 40 MG/ML IJ SUSP
40.0000 mg | Freq: Once | INTRAMUSCULAR | Status: AC
  Administered 2012-03-08: 40 mg via INTRAMUSCULAR

## 2012-03-08 MED ORDER — HYDROCODONE-ACETAMINOPHEN 5-500 MG PO TABS: ORAL_TABLET | ORAL | Status: DC

## 2012-03-08 NOTE — Assessment & Plan Note (Signed)
Back pain has deteriorated. He has no new neurological deficits. He is still awaiting neurosurgery once he has insurance and will be able to establish care. I advised him that he is on a pain contract and that he may not call for early refills. He was given Depo-Medrol IM injection in the office he will follow this with steroid dose pack. I refilled his narcotic medication does one time early

## 2012-03-08 NOTE — Patient Instructions (Signed)
Take the prednisone as directed for back pain Given injection in office  Pain medication refilled today  Keep previous f/u appt

## 2012-03-08 NOTE — Progress Notes (Signed)
  Subjective:    Patient ID: Charles Rivers, male    DOB: [birth year], 61 y.o.   MRN: [id number]  HPI  Patient presents with worsening chronic back pain. She was seen 2 weeks ago prescribed hydrocodone given 45 tablets. He's run out of his medication. He is taking his gabapentin. He is still uninsured and is asking for refill on pain medication today as well as pain relief in office. There has been no change in bowel or bladder no new neurological deficits  Review of Systems - per above  GEN- denies fatigue, fever, weight loss,weakness, recent illness MSK- + joint pain, muscle aches, injury       Objective:   Physical Exam GEN- NAD, alert and oriented x3 Spine- TTP lumbar,  Neuro- antalgic gait, stiff movement, no new focal deficits         Assessment & Plan:

## 2012-03-12 ENCOUNTER — Other Ambulatory Visit: Payer: Self-pay

## 2012-03-12 MED ORDER — ALBUTEROL SULFATE HFA 108 (90 BASE) MCG/ACT IN AERS: 2.0000 | INHALATION_SPRAY | RESPIRATORY_TRACT | Status: DC | PRN

## 2012-03-13 ENCOUNTER — Other Ambulatory Visit: Payer: Self-pay

## 2012-03-13 MED ORDER — ALBUTEROL SULFATE HFA 108 (90 BASE) MCG/ACT IN AERS: 2.0000 | INHALATION_SPRAY | RESPIRATORY_TRACT | Status: DC | PRN

## 2012-04-09 ENCOUNTER — Other Ambulatory Visit: Payer: Self-pay | Admitting: Family Medicine

## 2012-04-10 ENCOUNTER — Telehealth: Payer: Self-pay | Admitting: Family Medicine

## 2012-04-10 MED ORDER — RANITIDINE HCL 150 MG PO TABS: 150.0000 mg | ORAL_TABLET | Freq: Every day | ORAL | Status: DC

## 2012-04-10 NOTE — Telephone Encounter (Signed)
meds sent in

## 2012-05-24 ENCOUNTER — Ambulatory Visit: Payer: Self-pay | Admitting: Family Medicine

## 2012-05-31 ENCOUNTER — Telehealth: Payer: Self-pay | Admitting: Family Medicine

## 2012-05-31 NOTE — Telephone Encounter (Signed)
Patient called about getting a ticket for an expired handicap placard, he wanted a permanent placard and told nurse it was my fault it expired. Advised him I do not give permanent handicard placards on regular basis, he has to be re-evaluated for his handicap cards as needed. I can not provide anything for his appeal to the courts because it was his responsibility to see that it was expired.  Also advised going to health dept as he has been denied medicaid and is uninsured and needs further management for his back. He states they can not help him and he will try self pay at my office

## 2012-06-11 ENCOUNTER — Ambulatory Visit: Payer: Self-pay | Admitting: Family Medicine

## 2012-06-21 ENCOUNTER — Ambulatory Visit (INDEPENDENT_AMBULATORY_CARE_PROVIDER_SITE_OTHER): Payer: Self-pay | Admitting: Family Medicine

## 2012-06-21 ENCOUNTER — Encounter: Payer: Self-pay | Admitting: Family Medicine

## 2012-06-21 VITALS — BP 120/74 | HR 78 | Resp 16 | Ht 71.5 in | Wt 145.1 lb

## 2012-06-21 DIAGNOSIS — J45909 Unspecified asthma, uncomplicated: Secondary | ICD-10-CM

## 2012-06-21 DIAGNOSIS — F1210 Cannabis abuse, uncomplicated: Secondary | ICD-10-CM

## 2012-06-21 DIAGNOSIS — M549 Dorsalgia, unspecified: Secondary | ICD-10-CM

## 2012-06-21 MED ORDER — ALBUTEROL SULFATE HFA 108 (90 BASE) MCG/ACT IN AERS: 2.0000 | INHALATION_SPRAY | RESPIRATORY_TRACT | Status: DC | PRN

## 2012-06-21 MED ORDER — GABAPENTIN 300 MG PO CAPS: 300.0000 mg | ORAL_CAPSULE | Freq: Three times a day (TID) | ORAL | Status: DC

## 2012-06-21 MED ORDER — HYDROCODONE-ACETAMINOPHEN 5-500 MG PO TABS: ORAL_TABLET | ORAL | Status: DC

## 2012-06-21 MED ORDER — PREDNISONE (PAK) 5 MG PO TABS: 5.0000 mg | ORAL_TABLET | ORAL | Status: DC

## 2012-06-21 MED ORDER — RANITIDINE HCL 150 MG PO TABS: 150.0000 mg | ORAL_TABLET | Freq: Every day | ORAL | Status: DC

## 2012-06-21 NOTE — Assessment & Plan Note (Signed)
Patient admitted to using marijuana. States that he was self-medicating advised him on a pain contract he cannot have these substances as his legal. He voiced understanding and understands he cannot use marijuana any further. Urine drug screen will be obtained next visit to ensure compliance.

## 2012-06-21 NOTE — Progress Notes (Signed)
  Subjective:    Patient ID: Charles Rivers, male    DOB: Nov 30, 1950, 61 y.o.   MRN: [id number]  HPI  Patient here to followup chronic back pain and asthma. He's been out of his medication for the past month or so. He brings a letter with him today stating that he will receive Medicare started in March of 2014. His back pain continues to worsen but no new neurological deficits. He states that he has to sit in chair to move around his home to cooking cleaning because of severe pain with standing for long periods of time. He is also caring for his 4 children.  Asthma he has not required use of his inhaler which is now out. He does smoke marijuana but denies tobacco use.  Handi-cap placard to be completed  Review of Systems  GEN- denies fatigue, fever, weight loss,weakness, recent illness HEENT- denies eye drainage, change in vision, nasal discharge, CVS- denies chest pain, palpitations RESP- denies SOB, cough, wheeze ABD- denies N/V, change in stools, abd pain GU- denies dysuria, hematuria, dribbling, incontinence MSK- +joint pain, muscle aches, injury Neuro- denies headache, dizziness, syncope, seizure activity      Objective:   Physical Exam GEN- NAD, alert and oriented x3 HEENT- PERRL, EOMI, non injected sclera, pink conjunctiva, MMM, oropharynx clear CVS- RRR, no murmur RESP-CTAB EXT- No edema Pulses- Radial, DP- 2+ Back- TTP lumbar region, decreased ROM Neuro- no focal deficts Antalgic gait       Assessment & Plan:

## 2012-06-21 NOTE — Patient Instructions (Signed)
Continue current medications On pain contract Steroid shot given I recommend flu shot, you can get this at the health dept F/U 6 months

## 2012-06-21 NOTE — Assessment & Plan Note (Signed)
Advised flu shot, rare use of albuterol,

## 2012-06-21 NOTE — Assessment & Plan Note (Signed)
Plan for further treatment and evaluation once insurance established, no other options besides meds Given acute treatment depo medrol, prednisone dosepak today Pain meds refilled Temp handicap sticker Obtain MRI result again for chart, he has epidurals documented and xray in chart

## 2012-06-24 MED ORDER — METHYLPREDNISOLONE ACETATE 40 MG/ML IJ SUSP
40.0000 mg | Freq: Once | INTRAMUSCULAR | Status: AC
  Administered 2012-06-24: 40 mg via INTRAMUSCULAR

## 2012-06-24 NOTE — Addendum Note (Signed)
Addended by: Abner Greenspan on: 06/24/2012 07:52 AM   Modules accepted: Orders

## 2012-07-12 ENCOUNTER — Ambulatory Visit: Payer: Self-pay | Admitting: Family Medicine

## 2012-09-24 ENCOUNTER — Encounter: Payer: Self-pay | Admitting: Family Medicine

## 2012-09-24 ENCOUNTER — Ambulatory Visit (INDEPENDENT_AMBULATORY_CARE_PROVIDER_SITE_OTHER): Payer: Medicare PPO | Admitting: Family Medicine

## 2012-09-24 VITALS — BP 122/82 | HR 62 | Resp 16 | Wt 145.1 lb

## 2012-09-24 DIAGNOSIS — K3189 Other diseases of stomach and duodenum: Secondary | ICD-10-CM

## 2012-09-24 DIAGNOSIS — J45909 Unspecified asthma, uncomplicated: Secondary | ICD-10-CM

## 2012-09-24 DIAGNOSIS — M549 Dorsalgia, unspecified: Secondary | ICD-10-CM

## 2012-09-24 DIAGNOSIS — R1013 Epigastric pain: Secondary | ICD-10-CM

## 2012-09-24 MED ORDER — RANITIDINE HCL 150 MG PO TABS: 150.0000 mg | ORAL_TABLET | Freq: Every day | ORAL | Status: DC

## 2012-09-24 MED ORDER — ALBUTEROL SULFATE HFA 108 (90 BASE) MCG/ACT IN AERS: 2.0000 | INHALATION_SPRAY | RESPIRATORY_TRACT | Status: DC | PRN

## 2012-09-24 MED ORDER — GABAPENTIN 300 MG PO CAPS: 300.0000 mg | ORAL_CAPSULE | Freq: Three times a day (TID) | ORAL | Status: DC

## 2012-09-24 MED ORDER — HYDROCODONE-ACETAMINOPHEN 5-500 MG PO TABS: ORAL_TABLET | ORAL | Status: DC

## 2012-09-24 NOTE — Assessment & Plan Note (Signed)
Restart zantac does well on this  RTC for CPE

## 2012-09-24 NOTE — Progress Notes (Signed)
  Subjective:    Patient ID: Charles Rivers, male    DOB: May 16, 1951, 62 y.o.   MRN: [id number]  HPI Patient here to follow chronic back pain. He now has insurance and is ready to proceed with evaluation for his back pain. He continues to have radicular symptoms down the right side mostly at times he feels like his leg is giving out on him. He is in severe pain where he is unable to walk more than a block without having to stop. His acid reflux has returned without his medication will like to restart the Zantac. His breathing has done well. He is due for physical and fasting labs   Review of Systems  GEN- denies fatigue, fever, weight loss,weakness, recent illness HEENT- denies eye drainage, change in vision, nasal discharge, CVS- denies chest pain, palpitations RESP- denies SOB, cough, wheeze ABD- denies N/V, change in stools, abd pain GU- denies dysuria, hematuria, dribbling, incontinence MSK- + joint pain, muscle aches, injury Neuro- denies headache, dizziness, syncope, seizure activity      Objective:   Physical Exam GEN- NAD, alert and oriented x3 HEENT- PERRL, EOMI, non injected sclera, pink conjunctiva, MMM, oropharynx clear Neck- Supple, CVS- RRR, no murmur RESP-CTAB Back- TTP lumbar spine, + SLR EXT- No edema Pulses- Radial, DP- 2+        Assessment & Plan:

## 2012-09-24 NOTE — Assessment & Plan Note (Signed)
Refilled albuterol, rare use

## 2012-09-24 NOTE — Assessment & Plan Note (Signed)
MRI of lumbar spine needed, deteriorating symptoms over past few months, will need neurosurgery intervention Pain contract, restart gabapentin, vicodin

## 2012-09-24 NOTE — Patient Instructions (Addendum)
Medication restarted MRI of back to be done  Sign up for your pharmacy- Right source F/U 6 weeks- Complete physical- come fasting

## 2012-10-31 ENCOUNTER — Telehealth: Payer: Self-pay | Admitting: Family Medicine

## 2012-10-31 NOTE — Telephone Encounter (Signed)
Dr. Jeanice Lim stated that they two of would talk about this in his office visit on 4.29.14

## 2012-11-05 ENCOUNTER — Encounter: Payer: Medicare PPO | Admitting: Family Medicine

## 2012-11-07 ENCOUNTER — Emergency Department (HOSPITAL_COMMUNITY)
Admission: EM | Admit: 2012-11-07 | Discharge: 2012-11-07 | Disposition: A | Discharge status: Home / Self Care | Payer: Medicare PPO | Attending: Emergency Medicine | Admitting: Emergency Medicine

## 2012-11-07 ENCOUNTER — Emergency Department (HOSPITAL_COMMUNITY): Payer: Medicare PPO

## 2012-11-07 ENCOUNTER — Encounter (HOSPITAL_COMMUNITY): Payer: Self-pay

## 2012-11-07 DIAGNOSIS — Z8739 Personal history of other diseases of the musculoskeletal system and connective tissue: Secondary | ICD-10-CM | POA: Insufficient documentation

## 2012-11-07 DIAGNOSIS — Y9389 Activity, other specified: Secondary | ICD-10-CM | POA: Insufficient documentation

## 2012-11-07 DIAGNOSIS — G8929 Other chronic pain: Secondary | ICD-10-CM | POA: Insufficient documentation

## 2012-11-07 DIAGNOSIS — J45909 Unspecified asthma, uncomplicated: Secondary | ICD-10-CM | POA: Insufficient documentation

## 2012-11-07 DIAGNOSIS — M549 Dorsalgia, unspecified: Secondary | ICD-10-CM | POA: Insufficient documentation

## 2012-11-07 DIAGNOSIS — Z79899 Other long term (current) drug therapy: Secondary | ICD-10-CM | POA: Insufficient documentation

## 2012-11-07 DIAGNOSIS — S60219A Contusion of unspecified wrist, initial encounter: Secondary | ICD-10-CM | POA: Insufficient documentation

## 2012-11-07 DIAGNOSIS — Y9229 Other specified public building as the place of occurrence of the external cause: Secondary | ICD-10-CM | POA: Insufficient documentation

## 2012-11-07 DIAGNOSIS — W208XXA Other cause of strike by thrown, projected or falling object, initial encounter: Secondary | ICD-10-CM | POA: Insufficient documentation

## 2012-11-07 DIAGNOSIS — S60211A Contusion of right wrist, initial encounter: Secondary | ICD-10-CM

## 2012-11-07 MED ORDER — IBUPROFEN 600 MG PO TABS: 600.0000 mg | ORAL_TABLET | Freq: Four times a day (QID) | ORAL | Status: DC | PRN

## 2012-11-07 MED ORDER — ALBUTEROL SULFATE HFA 108 (90 BASE) MCG/ACT IN AERS: 1.0000 | INHALATION_SPRAY | Freq: Four times a day (QID) | RESPIRATORY_TRACT | Status: DC | PRN

## 2012-11-07 MED ORDER — IBUPROFEN 400 MG PO TABS
800.0000 mg | ORAL_TABLET | Freq: Once | ORAL | Status: AC
  Administered 2012-11-07: 800 mg via ORAL
  Filled 2012-11-07: qty 2

## 2012-11-07 NOTE — Progress Notes (Signed)
Orthopedic Tech Progress Note Patient Details:  Charles Rivers 1951-02-06 [id number]  Ortho Devices Type of Ortho Device: Velcro wrist splint Ortho Device/Splint Interventions: Ordered;Application   Jennye Moccasin 11/07/2012, 6:02 PM

## 2012-11-07 NOTE — ED Notes (Signed)
Pt. Was at [facility] and holding a cup for tea and the top lid of the ice maker fell onto his rt. Wrist.  Approximately 4-5 lbs.    +ROM.  No selling or deformity noted.

## 2012-11-07 NOTE — ED Provider Notes (Signed)
History    This chart was scribed for Charles Rivers (PA) non-physician practitioner working with Doug Sou, MD by Sofie Rower, ED Scribe. This patient was seen in room TR04C/TR04C and the patient's care was started at 4:43PM.   CSN: [id number]  Arrival date & time 11/07/12  1556   First MD Initiated Contact with Patient 11/07/12 1643      Chief Complaint  Patient presents with  . Wrist Pain    (Consider location/radiation/quality/duration/timing/severity/associated sxs/prior treatment) The history is provided by the patient. No language interpreter was used.    Charles Rivers is a 62 y.o. male , with a hx of asthma, chronic back pain, and right middle finger reconstruction, who presents to the Emergency Department complaining of sudden, progressively worsening, non radiating, wrist pain located at his right wrist, onset today (11/07/12). The pt reports he was at [facility] eating earlier this afternoon, and while filling his cup full of ice, the top of the ice maker, fell, and impacted directly upon his right wrist. Modifying factors include certain movements and positions of the right wrist which intensifies the right wirst pain.  The pt denies any other injuries.   The pt does not smoke, however he does drink alcohol on a weekly basis.   PCP is Dr. Jeanice Lim.    Past Medical History  Diagnosis Date  . Asthma   . Chronic back pain     Past Surgical History  Procedure Laterality Date  . Right middle finger reconstruction      Family History  Problem Relation Age of Onset  . Diabetes Mother   . Cancer Mother     breast  . Cancer Father   . Asthma Brother     History  Substance Use Topics  . Smoking status: Never Smoker   . Smokeless tobacco: Not on file  . Alcohol Use: Yes     Comment: 2-3 beers a week      Review of Systems  Musculoskeletal: Positive for arthralgias.  All other systems reviewed and are negative.    Allergies  Review of patient's allergies  indicates no known allergies.  Home Medications   Current Outpatient Rx  Name  Route  Sig  Dispense  Refill  . albuterol (VENTOLIN HFA) 108 (90 BASE) MCG/ACT inhaler   Inhalation   Inhale 2 puffs into the lungs every 4 (four) hours as needed for wheezing.   18 each   3   . gabapentin (NEURONTIN) 300 MG capsule   Oral   Take 1 capsule (300 mg total) by mouth 3 (three) times daily.   90 capsule   3   . HYDROcodone-acetaminophen (VICODIN) 5-500 MG per tablet      TAKE 1 TABLET EVERY 6 HOURS AS NEEDED FOR PAIN. MUST LAST 30 DAYS.   45 tablet   2   . ranitidine (ZANTAC) 150 MG tablet   Oral   Take 1 tablet (150 mg total) by mouth at bedtime.   30 tablet   3   . albuterol (PROVENTIL HFA;VENTOLIN HFA) 108 (90 BASE) MCG/ACT inhaler   Inhalation   Inhale 1-2 puffs into the lungs every 6 (six) hours as needed for wheezing.   1 Inhaler   2   . ibuprofen (ADVIL,MOTRIN) 600 MG tablet   Oral   Take 1 tablet (600 mg total) by mouth every 6 (six) hours as needed for pain.   30 tablet   0     BP 130/68  Pulse 78  Temp(Src) 97.2 F (36.2 C) (Oral)  Resp 20  SpO2 100%  Physical Exam  Nursing note and vitals reviewed. Constitutional: He is oriented to person, place, and time. He appears well-developed and well-nourished. No distress.  HENT:  Head: Normocephalic and atraumatic.  Eyes: EOM are normal.  Neck: Neck supple. No tracheal deviation present.  Cardiovascular: Normal rate.   Pulmonary/Chest: Effort normal. No respiratory distress.  Musculoskeletal: Normal range of motion.       Right wrist: He exhibits tenderness, bony tenderness and swelling. He exhibits normal range of motion, no effusion, no crepitus, no deformity and no laceration.       Arms: Neurological: He is alert and oriented to person, place, and time.  Skin: Skin is warm and dry.  Psychiatric: He has a normal mood and affect. His behavior is normal.    ED Course  Procedures (including critical care  time)  DIAGNOSTIC STUDIES: Oxygen Saturation is 100% on room air, normal by my interpretation.    COORDINATION OF CARE:   5:00 PM- Treatment plan discussed with patient. Pt agrees with treatment.  ibuprofen (ADVIL,MOTRIN) 600 MG tablet Take 1 tablet (600 mg total) by mouth every 6 (six) hours as needed for pain. 30     Labs Reviewed - No data to display Dg Wrist Complete Right  11/07/2012  *RADIOLOGY REPORT*  Clinical Data: Blunt injury.  Top of ice machine hit wrist.  Pain.  RIGHT WRIST - COMPLETE 3+ VIEW  Comparison:  None.  Findings:  There is no evidence of fracture or dislocation.  There is no evidence of arthropathy or other focal bone abnormality. Soft tissues are unremarkable.  IMPRESSION: Negative.   Original Report Authenticated By: Davonna Belling, M.D.      1. Wrist contusion, right, initial encounter       MDM  Pt has been advised of the symptoms that warrant their return to the ED. Patient has voiced understanding and has agreed to follow-up with the PCP or specialist.  I personally performed the services described in this documentation, which was scribed in my presence. The recorded information has been reviewed and is accurate.   Dorthula Matas, PA-C 11/07/12 1719

## 2012-11-08 NOTE — ED Provider Notes (Signed)
Medical screening examination/treatment/procedure(s) were performed by non-physician practitioner and as supervising physician I was immediately available for consultation/collaboration.  Doug Sou, MD 11/08/12 [phone]

## 2012-11-14 ENCOUNTER — Encounter: Payer: Medicare PPO | Admitting: Family Medicine

## 2012-11-28 ENCOUNTER — Encounter: Payer: Medicare PPO | Admitting: Family Medicine

## 2012-11-29 ENCOUNTER — Encounter: Payer: Self-pay | Admitting: Family Medicine

## 2012-12-25 ENCOUNTER — Other Ambulatory Visit: Payer: Self-pay | Admitting: Family Medicine

## 2012-12-25 NOTE — Telephone Encounter (Signed)
?  ok to refill °

## 2012-12-26 NOTE — Telephone Encounter (Signed)
?  ok to refill °

## 2012-12-26 NOTE — Telephone Encounter (Signed)
May refill x 1, needs physical exam before further fills

## 2012-12-26 NOTE — Telephone Encounter (Signed)
Med called out 

## 2013-01-08 ENCOUNTER — Ambulatory Visit (INDEPENDENT_AMBULATORY_CARE_PROVIDER_SITE_OTHER): Payer: Medicare HMO | Admitting: Family Medicine

## 2013-01-08 ENCOUNTER — Telehealth: Payer: Self-pay | Admitting: Family Medicine

## 2013-01-08 ENCOUNTER — Encounter: Payer: Self-pay | Admitting: Family Medicine

## 2013-01-08 VITALS — BP 118/70 | HR 60 | Temp 97.1°F | Resp 18 | Ht 71.0 in | Wt 134.0 lb

## 2013-01-08 DIAGNOSIS — K3189 Other diseases of stomach and duodenum: Secondary | ICD-10-CM

## 2013-01-08 DIAGNOSIS — Z125 Encounter for screening for malignant neoplasm of prostate: Secondary | ICD-10-CM

## 2013-01-08 DIAGNOSIS — N529 Male erectile dysfunction, unspecified: Secondary | ICD-10-CM

## 2013-01-08 DIAGNOSIS — M549 Dorsalgia, unspecified: Secondary | ICD-10-CM

## 2013-01-08 DIAGNOSIS — M5126 Other intervertebral disc displacement, lumbar region: Secondary | ICD-10-CM

## 2013-01-08 DIAGNOSIS — M5136 Other intervertebral disc degeneration, lumbar region: Secondary | ICD-10-CM

## 2013-01-08 DIAGNOSIS — E785 Hyperlipidemia, unspecified: Secondary | ICD-10-CM | POA: Insufficient documentation

## 2013-01-08 DIAGNOSIS — R1013 Epigastric pain: Secondary | ICD-10-CM

## 2013-01-08 DIAGNOSIS — Z1211 Encounter for screening for malignant neoplasm of colon: Secondary | ICD-10-CM

## 2013-01-08 DIAGNOSIS — R195 Other fecal abnormalities: Secondary | ICD-10-CM

## 2013-01-08 LAB — LIPID PANEL
LDL Cholesterol: 168 mg/dL — ABNORMAL HIGH (ref 0–99)
Total CHOL/HDL Ratio: 4.7 Ratio

## 2013-01-08 LAB — CBC WITH DIFFERENTIAL/PLATELET
Eosinophils Absolute: 0.3 10*3/uL (ref 0.0–0.7)
Eosinophils Relative: 7 % — ABNORMAL HIGH (ref 0–5)
HCT: 46.4 % (ref 39.0–52.0)
Lymphs Abs: 2.4 10*3/uL (ref 0.7–4.0)
MCH: 28.8 pg (ref 26.0–34.0)
MCV: 83.5 fL (ref 78.0–100.0)
Monocytes Absolute: 0.4 10*3/uL (ref 0.1–1.0)
Platelets: 258 10*3/uL (ref 150–400)
RDW: 14.7 % (ref 11.5–15.5)

## 2013-01-08 LAB — COMPREHENSIVE METABOLIC PANEL
ALT: 16 U/L (ref 0–53)
Alkaline Phosphatase: 61 U/L (ref 39–117)
Sodium: 139 mEq/L (ref 135–145)
Total Bilirubin: 0.8 mg/dL (ref 0.3–1.2)
Total Protein: 7 g/dL (ref 6.0–8.3)

## 2013-01-08 LAB — PSA, MEDICARE: PSA: 1.81 ng/mL (ref <=4.00)

## 2013-01-08 MED ORDER — HYDROCODONE-ACETAMINOPHEN 5-325 MG PO TABS: ORAL_TABLET | ORAL | Status: DC

## 2013-01-08 MED ORDER — SIMVASTATIN 10 MG PO TABS: 10.0000 mg | ORAL_TABLET | Freq: Every day | ORAL | Status: DC

## 2013-01-08 MED ORDER — SILDENAFIL CITRATE 50 MG PO TABS: 50.0000 mg | ORAL_TABLET | ORAL | Status: DC | PRN

## 2013-01-08 NOTE — Progress Notes (Signed)
  Subjective:    Patient ID: Charles Rivers, male    DOB: 1950/07/16, 62 y.o.   MRN: [id number]  HPI  Pt here for physical and f/u chronic medical problems. Due for colonoscopy, due for prostate check. Would like to have referral back for MRI, needs refill on pain meds. Back is getting worse, has pain down his right leg most of day. Feels like his legs give out on him. Unable to sit for long periods of time or stand long periods of time due to pain.  Asthma- doing well rare use of inhalers  ED- asks for refill on viagra  Review of Systems  GEN- denies fatigue, fever, weight loss,weakness, recent illness HEENT- denies eye drainage, change in vision, nasal discharge, CVS- denies chest pain, palpitations RESP- denies SOB, cough, wheeze ABD- denies N/V, change in stools, abd pain GU- denies dysuria, hematuria, dribbling, incontinence MSK-+ joint pain, muscle aches, injury Neuro- denies headache, dizziness, syncope, seizure activity      Objective:   Physical Exam GEN- NAD, alert and oriented x3 HEENT- PERRL, EOMI, non injected sclera, pink conjunctiva, MMM, oropharynx clear CVS- RRR, no murmur RESP-CTAB ABD-NABS,soft,NT,ND Rectum- normal external appearence, normal tone, FOBT positive, no prostate enlargement EXT- No edema Pulses- Radial, DP- 2+ Back- TTP lumbar region, decreased ROM, +SLR right side Neuro- no focal deficts Antalgic gait, DTR symmetric bilat LE, normal tone        Assessment & Plan:

## 2013-01-08 NOTE — Assessment & Plan Note (Signed)
MRI L spine now that insurance is stable Has had epidurals in past, has been on gabapentin, hydrocodone and anti-inflammatories over the past year  Radicular symptoms worsening

## 2013-01-08 NOTE — Assessment & Plan Note (Signed)
Refer for colonoscopy 

## 2013-01-08 NOTE — Assessment & Plan Note (Signed)
Well-controlled on PPI. ?

## 2013-01-08 NOTE — Patient Instructions (Signed)
MRI of back to be done Colonoscopy to be done Fasting labs to be done  Continue current medications F/U 4 months

## 2013-01-08 NOTE — Telephone Encounter (Signed)
Can you sign a handicap placard for him?

## 2013-01-08 NOTE — Assessment & Plan Note (Signed)
Worsening lipid panel, start zocor 10mg 

## 2013-01-08 NOTE — Telephone Encounter (Signed)
Done 6 months only,

## 2013-01-08 NOTE — Assessment & Plan Note (Signed)
Discussed PSA, will screen Viagra given

## 2013-01-22 ENCOUNTER — Encounter: Payer: Self-pay | Admitting: *Deleted

## 2013-01-24 ENCOUNTER — Ambulatory Visit
Admission: RE | Admit: 2013-01-24 | Discharge: 2013-01-24 | Disposition: A | Discharge status: Home / Self Care | Payer: Medicare PPO | Source: Ambulatory Visit | Attending: Family Medicine | Admitting: Family Medicine

## 2013-01-24 ENCOUNTER — Other Ambulatory Visit: Payer: Medicare PPO

## 2013-01-24 DIAGNOSIS — M5126 Other intervertebral disc displacement, lumbar region: Secondary | ICD-10-CM

## 2013-01-24 DIAGNOSIS — M549 Dorsalgia, unspecified: Secondary | ICD-10-CM

## 2013-01-26 ENCOUNTER — Other Ambulatory Visit: Payer: Self-pay | Admitting: Family Medicine

## 2013-01-26 DIAGNOSIS — M549 Dorsalgia, unspecified: Secondary | ICD-10-CM

## 2013-01-26 DIAGNOSIS — M48061 Spinal stenosis, lumbar region without neurogenic claudication: Secondary | ICD-10-CM

## 2013-01-26 DIAGNOSIS — M47816 Spondylosis without myelopathy or radiculopathy, lumbar region: Secondary | ICD-10-CM

## 2013-01-28 ENCOUNTER — Other Ambulatory Visit: Payer: Self-pay | Admitting: Family Medicine

## 2013-01-28 NOTE — Telephone Encounter (Signed)
He was given refill on 7/2 for 45 tablets, with 1 refill, too early for refills

## 2013-01-28 NOTE — Telephone Encounter (Signed)
Ok to refill 

## 2013-01-28 NOTE — Telephone Encounter (Signed)
Medication was denied

## 2013-02-24 ENCOUNTER — Telehealth: Payer: Self-pay | Admitting: Family Medicine

## 2013-02-24 NOTE — Telephone Encounter (Signed)
Okay to refill inhaler and pain meds

## 2013-02-24 NOTE — Telephone Encounter (Signed)
?   OK to Refill  

## 2013-02-24 NOTE — Telephone Encounter (Signed)
Prednisone 5 mg 6 day pack Take as directed per package instructions #21

## 2013-02-24 NOTE — Telephone Encounter (Signed)
No refill on prednisone

## 2013-02-25 ENCOUNTER — Ambulatory Visit (INDEPENDENT_AMBULATORY_CARE_PROVIDER_SITE_OTHER): Payer: Medicare HMO | Admitting: Family Medicine

## 2013-02-25 ENCOUNTER — Encounter: Payer: Self-pay | Admitting: Family Medicine

## 2013-02-25 VITALS — BP 124/72 | HR 68 | Temp 97.3°F | Ht 71.0 in | Wt 131.0 lb

## 2013-02-25 DIAGNOSIS — M549 Dorsalgia, unspecified: Secondary | ICD-10-CM

## 2013-02-25 DIAGNOSIS — M5126 Other intervertebral disc displacement, lumbar region: Secondary | ICD-10-CM

## 2013-02-25 DIAGNOSIS — M5136 Other intervertebral disc degeneration, lumbar region: Secondary | ICD-10-CM

## 2013-02-25 MED ORDER — HYDROCODONE-ACETAMINOPHEN 7.5-325 MG PO TABS: 1.0000 | ORAL_TABLET | Freq: Four times a day (QID) | ORAL | Status: DC | PRN

## 2013-02-25 MED ORDER — ALBUTEROL SULFATE HFA 108 (90 BASE) MCG/ACT IN AERS: 1.0000 | INHALATION_SPRAY | Freq: Four times a day (QID) | RESPIRATORY_TRACT | Status: DC | PRN

## 2013-02-25 NOTE — Progress Notes (Signed)
  Subjective:    Patient ID: Charles Rivers, male    DOB: 11-27-1950, 62 y.o.   MRN: [id number]  HPI Patient here secondary to back pain. He has an appointment with neurosurgery on Thursday. He did not receive a message back from the nursing staff regarding his pain medications therefore he schedule an appointment. He ran out of his hydrocodone. Last month he was given a prednisone burst which helps some. He is also taking his gabapentin as prescribed. Typically takes up to 4 tablets a day of the hydrocodone depending on how severe his back pain and some days he does not take any Medication. No change in bowel or bladder recently Continues to get radiating pain down right leg   Review of Systems  GEN- denies fatigue, fever, weight loss,weakness, recent illness HEENT- denies eye drainage, change in vision, nasal discharge, CVS- denies chest pain, palpitations RESP- denies SOB, cough, wheeze ABD- denies N/V, change in stools, abd pain GU- denies dysuria, hematuria, dribbling, incontinence MSK-+ joint pain, muscle aches, injury Neuro- denies headache, dizziness, syncope, seizure activity      Objective:   Physical Exam GEN- NAD, alert and oriented x3 EXT- No edema Back- TTP lumbar region, decreased ROM, +SLR right side Neuro- no focal defict,s Antalgic gait, DTR symmetric bilat LE, normal tone, some decreaed strength on RLE today compared to left ? pain       Assessment & Plan:

## 2013-02-25 NOTE — Assessment & Plan Note (Signed)
Recent MRI, to see neurosurgery on Thurs Change pain meds to 7.5mg  norco

## 2013-02-25 NOTE — Telephone Encounter (Signed)
Pharmacy aware

## 2013-02-25 NOTE — Assessment & Plan Note (Signed)
MRI notes nerve irritation on right side

## 2013-02-25 NOTE — Patient Instructions (Signed)
Norco increased to 7.5mg   Continue gabapentin F/U with neurosurgery

## 2013-02-26 ENCOUNTER — Other Ambulatory Visit: Payer: Self-pay | Admitting: Family Medicine

## 2013-02-26 MED ORDER — ALBUTEROL SULFATE HFA 108 (90 BASE) MCG/ACT IN AERS: 1.0000 | INHALATION_SPRAY | Freq: Four times a day (QID) | RESPIRATORY_TRACT | Status: DC | PRN

## 2013-02-26 NOTE — Telephone Encounter (Signed)
Rx Refilled  

## 2013-04-02 ENCOUNTER — Telehealth: Payer: Self-pay | Admitting: Family Medicine

## 2013-04-02 NOTE — Telephone Encounter (Signed)
?   Ok to refill,last refill 01/30/13 °

## 2013-04-02 NOTE — Telephone Encounter (Signed)
Viagra 50 mg tab 1 prn #3 Ventolin HFA inhaler D.C. Inhale 2 puffs into lungs q4 hours prn wheezing #18 gm

## 2013-04-09 ENCOUNTER — Ambulatory Visit: Payer: Medicare HMO | Admitting: Family Medicine

## 2013-04-11 ENCOUNTER — Encounter: Payer: Self-pay | Admitting: Family Medicine

## 2013-04-11 ENCOUNTER — Ambulatory Visit (INDEPENDENT_AMBULATORY_CARE_PROVIDER_SITE_OTHER): Payer: Medicare HMO | Admitting: Family Medicine

## 2013-04-11 VITALS — BP 98/60 | HR 70 | Temp 97.1°F | Resp 18 | Wt 136.0 lb

## 2013-04-11 DIAGNOSIS — M549 Dorsalgia, unspecified: Secondary | ICD-10-CM

## 2013-04-11 DIAGNOSIS — E785 Hyperlipidemia, unspecified: Secondary | ICD-10-CM

## 2013-04-11 MED ORDER — GABAPENTIN 300 MG PO CAPS: ORAL_CAPSULE | ORAL | Status: DC

## 2013-04-11 MED ORDER — SILDENAFIL CITRATE 50 MG PO TABS: 50.0000 mg | ORAL_TABLET | ORAL | Status: DC | PRN

## 2013-04-11 MED ORDER — RANITIDINE HCL 150 MG PO TABS: 150.0000 mg | ORAL_TABLET | Freq: Every day | ORAL | Status: DC

## 2013-04-11 NOTE — Patient Instructions (Addendum)
We will call with lab results Continue current meds Increase gabapentin to 600mg  at bedtime Viagra coupon Call and reschedule with neurosurgery I recommend Flu shot for your F/U 3 months

## 2013-04-13 ENCOUNTER — Encounter: Payer: Self-pay | Admitting: Family Medicine

## 2013-04-13 NOTE — Progress Notes (Signed)
  Subjective:    Patient ID: Charles Rivers, male    DOB: February 15, 1951, 62 y.o.   MRN: [id number]  HPI Pt here to f/u medications. Was referred to neurosurgery but missed appt because he did not have his copay. Has not rescheduled yet. Continues to have back pain and asked for [facility] services. States pain difficult at bedtime because of his activity during the day with his children. Has to sit down to cook and wash dishes at time due to pain  ED- request another prescription for viagra free trial he lost his last script  Hyperlipidemia- started on statin drug due for recheck of of labs and LFT   Missed colonoscopy appt Review of Systems  GEN- denies fatigue, fever, weight loss,weakness, recent illness HEENT- denies eye drainage, change in vision, nasal discharge, CVS- denies chest pain, palpitations RESP- denies SOB, cough, wheeze ABD- denies N/V, change in stools, abd pain GU- denies dysuria, hematuria, dribbling, incontinence MSK- + joint pain, muscle aches, injury Neuro- denies headache, dizziness, syncope, seizure activity       Objective:   Physical Exam  GEN-NAD, alert and oriented x 3 CVS- RRR, no murmur RESP-CTAB Ext- No edema,  Pulse- Radial 2+       Assessment & Plan:

## 2013-04-13 NOTE — Assessment & Plan Note (Signed)
He has not seen neurosurgery yet for recommendations, on pain contract Increase gabapentin to 600mg  at bedtime I do not think he warrents [facility] services at this time

## 2013-04-13 NOTE — Assessment & Plan Note (Signed)
Check FLP and LFT 

## 2013-04-30 ENCOUNTER — Other Ambulatory Visit: Payer: Self-pay | Admitting: Family Medicine

## 2013-04-30 NOTE — Telephone Encounter (Signed)
Medication refilled per protocol. 

## 2013-05-12 ENCOUNTER — Ambulatory Visit: Payer: Medicare HMO | Admitting: Family Medicine

## 2013-05-31 ENCOUNTER — Encounter (HOSPITAL_COMMUNITY): Payer: Self-pay | Admitting: Emergency Medicine

## 2013-05-31 ENCOUNTER — Emergency Department (HOSPITAL_COMMUNITY): Payer: Medicare PPO

## 2013-05-31 ENCOUNTER — Emergency Department (HOSPITAL_COMMUNITY)
Admission: EM | Admit: 2013-05-31 | Discharge: 2013-05-31 | Disposition: A | Discharge status: Home / Self Care | Payer: Medicare PPO | Attending: Emergency Medicine | Admitting: Emergency Medicine

## 2013-05-31 DIAGNOSIS — E785 Hyperlipidemia, unspecified: Secondary | ICD-10-CM | POA: Insufficient documentation

## 2013-05-31 DIAGNOSIS — Z79899 Other long term (current) drug therapy: Secondary | ICD-10-CM | POA: Insufficient documentation

## 2013-05-31 DIAGNOSIS — Y929 Unspecified place or not applicable: Secondary | ICD-10-CM | POA: Insufficient documentation

## 2013-05-31 DIAGNOSIS — G8929 Other chronic pain: Secondary | ICD-10-CM | POA: Insufficient documentation

## 2013-05-31 DIAGNOSIS — K047 Periapical abscess without sinus: Secondary | ICD-10-CM

## 2013-05-31 DIAGNOSIS — Y9389 Activity, other specified: Secondary | ICD-10-CM | POA: Insufficient documentation

## 2013-05-31 DIAGNOSIS — M79644 Pain in right finger(s): Secondary | ICD-10-CM

## 2013-05-31 DIAGNOSIS — W268XXA Contact with other sharp object(s), not elsewhere classified, initial encounter: Secondary | ICD-10-CM | POA: Insufficient documentation

## 2013-05-31 DIAGNOSIS — Z87891 Personal history of nicotine dependence: Secondary | ICD-10-CM | POA: Insufficient documentation

## 2013-05-31 DIAGNOSIS — M79609 Pain in unspecified limb: Secondary | ICD-10-CM | POA: Insufficient documentation

## 2013-05-31 DIAGNOSIS — M549 Dorsalgia, unspecified: Secondary | ICD-10-CM | POA: Insufficient documentation

## 2013-05-31 DIAGNOSIS — J45909 Unspecified asthma, uncomplicated: Secondary | ICD-10-CM | POA: Insufficient documentation

## 2013-05-31 MED ORDER — HYDROCODONE-ACETAMINOPHEN 5-325 MG PO TABS
2.0000 | ORAL_TABLET | Freq: Once | ORAL | Status: AC
  Administered 2013-05-31: 2 via ORAL
  Filled 2013-05-31: qty 2

## 2013-05-31 MED ORDER — HYDROCODONE-ACETAMINOPHEN 5-325 MG PO TABS: 1.0000 | ORAL_TABLET | ORAL | Status: DC | PRN

## 2013-05-31 MED ORDER — AMOXICILLIN 500 MG PO CAPS: 500.0000 mg | ORAL_CAPSULE | Freq: Three times a day (TID) | ORAL | Status: DC

## 2013-05-31 NOTE — ED Provider Notes (Signed)
CSN: [id number]     Arrival date & time 05/31/13  1854 History  This chart was scribed for non-physician practitioner Johnnette Gourd, PA-C, working with Hurman Horn, MD by Dorothey Baseman, ED Scribe. This patient was seen in room TR09C/TR09C and the patient's care was started at 8:13 PM.    Chief Complaint  Patient presents with  . Finger Injury  . Jaw Pain   The history is provided by the patient. No language interpreter was used.   HPI Comments: Charles Rivers is a 62 y.o. male who presents to the Emergency Department complaining of a constant pain to the right index finger onset yesterday when he reports that he was putting brake pads on his car and a sliver went into his finger. He states that it feels like there is a retained foreign body in the area.  Patient is also complaining of a right-sided, lower jaw pain that he states feels "like it popped" with sudden onset yesterday. Denies fever, chills, difficulty swallowing, trismus. States the area feels swollen. He denies any allergies to medications.   Past Medical History  Diagnosis Date  . Asthma   . Chronic back pain   . Hyperlipidemia    Past Surgical History  Procedure Laterality Date  . Right middle finger reconstruction     Family History  Problem Relation Age of Onset  . Diabetes Mother   . Cancer Mother     breast  . Cancer Father   . Asthma Brother    History  Substance Use Topics  . Smoking status: Former Games developer  . Smokeless tobacco: Never Used  . Alcohol Use: Yes     Comment: 2-3 beers a week    Review of Systems  A complete 10 system review of systems was obtained and all systems are negative except as noted in the HPI and PMH.   Allergies  Review of patient's allergies indicates no known allergies.  Home Medications   Current Outpatient Rx  Name  Route  Sig  Dispense  Refill  . albuterol (PROVENTIL HFA;VENTOLIN HFA) 108 (90 BASE) MCG/ACT inhaler   Inhalation   Inhale 1-2 puffs into the lungs every  6 (six) hours as needed for wheezing.   1 Inhaler   11   . gabapentin (NEURONTIN) 300 MG capsule      Take 1 tab BID and 2 QHS   120 capsule   3   . HYDROcodone-acetaminophen (NORCO) 7.5-325 MG per tablet   Oral   Take 1 tablet by mouth every 6 (six) hours as needed for pain.   60 tablet   2   . ibuprofen (ADVIL,MOTRIN) 600 MG tablet   Oral   Take 1 tablet (600 mg total) by mouth every 6 (six) hours as needed for pain.   30 tablet   0   . ranitidine (ZANTAC) 150 MG tablet   Oral   Take 1 tablet (150 mg total) by mouth at bedtime.   30 tablet   3   . simvastatin (ZOCOR) 10 MG tablet   Oral   Take 1 tablet (10 mg total) by mouth at bedtime.   30 tablet   3   . VIAGRA 50 MG tablet      TAKE 1 TABLET BY MOUTH AS NEEDED FOR ERECTILE DYSFUNCTION.   3 tablet   0    Triage Vitals: BP 145/88  Pulse 58  Temp(Src) 98 F (36.7 C) (Oral)  Resp 20  Wt 137 lb 4.8  oz (62.279 kg)  SpO2 99%  Physical Exam  Nursing note and vitals reviewed. Constitutional: He is oriented to person, place, and time. He appears well-developed and well-nourished. No distress.  HENT:  Head: Normocephalic and atraumatic.  Enlarged and tender submandibular lymph node on the right. Poor dentition. 2nd molar has surrounding erythema and edema without abscess.  Eyes: Conjunctivae and EOM are normal.  Neck: Normal range of motion. Neck supple.  Cardiovascular: Normal rate, regular rhythm and normal heart sounds.   Pulmonary/Chest: Effort normal and breath sounds normal.  Musculoskeletal: Normal range of motion. He exhibits no edema.  Tenderness to palpation to the proximal phalanx of the right index finger. No entry wound or foreign body palpated.   Neurological: He is alert and oriented to person, place, and time.  Skin: Skin is warm and dry.  Psychiatric: He has a normal mood and affect. His behavior is normal.    ED Course  Procedures (including critical care time)  DIAGNOSTIC  STUDIES: Oxygen Saturation is 99% on room air, normal by my interpretation.    COORDINATION OF CARE: 8:15 PM- Will order an x-ray of the right index finger. Will order pain medication. Will discharge patient with antibiotics and pain medication. Discussed treatment plan with patient at bedside and patient verbalized agreement.   9:45 PM- Discussed that x-ray results indicate a foreign body retained in the right, middle finger. Discussed treatment plan with patient at bedside and patient verbalized agreement.    Labs Review Labs Reviewed - No data to display  Imaging Review Dg Finger Index Right  05/31/2013   CLINICAL DATA:  Finger injury. Assess for foreign body near in the distal interphalangeal joint  EXAM: RIGHT INDEX FINGER 2+V  COMPARISON:  None.  FINDINGS: There is no evidence of fracture or dislocation. There is no radiopaque foreign body in the right 2nd digit. There is radiopaque foreign body in the dorsal soft tissues near the proximal aspect of the 3rd middle phalanx measuring 1.6 mm.  IMPRESSION: No acute fracture or dislocation. There is no radiopaque foreign body in the right 2nd digit. There is radiopaque foreign body in the dorsal soft tissues near the proximal aspect of the 3rd middle phalanx measuring 1.6 mm.   Electronically Signed   By: Sherian Rein M.D.   On: 05/31/2013 21:36    EKG Interpretation   None       MDM   1. Dental infection   2. Finger pain, right     Dental pain associated with dental infection. No evidence of dental abscess. Patient is afebrile, non toxic appearing and swallowing secretions well. I gave patient referral to dentist and stressed the importance of dental follow up for ultimate management of dental pain. I will also give amoxicillin and pain control. Patient voices understanding and is agreeable to plan. Xray of index finger does not show any foreign body, however there is one seen in the middle finger. Patient is not reporting pain to  that area, re-instated on re-examination. No entry wound, tenderness. Stable for discharge, f/u with hand if he would like possible FB evaluated. Case discussed with attending Dr. Fonnie Jarvis who agrees with plan of care.   I personally performed the services described in this documentation, which was scribed in my presence. The recorded information has been reviewed and is accurate.     Trevor Mace, PA-C 05/31/13 2219  Trevor Mace, PA-C 05/31/13 2221

## 2013-05-31 NOTE — ED Notes (Signed)
Patient was putting brake pads on his car yesterday and feels like he has a sliver in his right index finger.  Also c/o jaw pain.  Stated it felt like it popped.  Unsure if he has gotten something in his jaw

## 2013-06-01 NOTE — ED Provider Notes (Signed)
Medical screening examination/treatment/procedure(s) were performed by non-physician practitioner and as supervising physician I was immediately available for consultation/collaboration.  Raima Geathers M Matti Killingsworth, MD 06/01/13 1247 

## 2013-06-02 ENCOUNTER — Telehealth: Payer: Self-pay | Admitting: Family Medicine

## 2013-06-02 ENCOUNTER — Other Ambulatory Visit: Payer: Self-pay | Admitting: *Deleted

## 2013-06-02 MED ORDER — HYDROCODONE-ACETAMINOPHEN 7.5-325 MG PO TABS: 1.0000 | ORAL_TABLET | Freq: Four times a day (QID) | ORAL | Status: DC | PRN

## 2013-06-02 NOTE — Telephone Encounter (Signed)
Patient needs his Zantac, NORCO, Viagra, Proventil refilled. Also wants to see about getting a permanent handicap sticker .

## 2013-06-04 NOTE — Telephone Encounter (Signed)
Pt came in on 06/03/13 to pick Norco RX and 6mos handicap placard

## 2013-06-25 ENCOUNTER — Telehealth: Payer: Self-pay | Admitting: Family Medicine

## 2013-06-25 MED ORDER — HYDROCODONE-ACETAMINOPHEN 7.5-325 MG PO TABS: 1.0000 | ORAL_TABLET | Freq: Four times a day (QID) | ORAL | Status: DC | PRN

## 2013-06-25 MED ORDER — SILDENAFIL CITRATE 50 MG PO TABS: 50.0000 mg | ORAL_TABLET | Freq: Every day | ORAL | Status: DC | PRN

## 2013-06-25 NOTE — Telephone Encounter (Signed)
?   Ok to refill hydrocodone and viagra, last refill hydrocodone 06/02/13, last ov 04/11/13

## 2013-06-25 NOTE — Telephone Encounter (Signed)
Med refilled, pt can not pick rx up until Dec 23rd

## 2013-06-25 NOTE — Telephone Encounter (Signed)
He can not pick up until Dec 23rd

## 2013-06-25 NOTE — Telephone Encounter (Signed)
?   Ok to refill hydrocodone and viagra, last refill hydrocodone 06/02/13, last ov 04/11/13 

## 2013-06-25 NOTE — Telephone Encounter (Signed)
Pt is wanting a refill on his hydrocodone,inhaler,viagar,zantac Pharmacy is [facility] back number is [phone]

## 2013-07-14 ENCOUNTER — Ambulatory Visit: Payer: Medicare HMO | Admitting: Family Medicine

## 2013-07-22 ENCOUNTER — Ambulatory Visit: Payer: Medicare HMO | Admitting: Family Medicine

## 2013-07-30 ENCOUNTER — Telehealth: Payer: Self-pay | Admitting: Family Medicine

## 2013-07-30 NOTE — Telephone Encounter (Signed)
Call back number is [phone] Pt is calling to check on his prescription request for his inhaler,hydrocodone and his percocet. FYI the man is not happy that he has to call every month to request his medications I explained to him that it is a new law that we have to follow

## 2013-07-31 MED ORDER — ALBUTEROL SULFATE HFA 108 (90 BASE) MCG/ACT IN AERS: 1.0000 | INHALATION_SPRAY | Freq: Four times a day (QID) | RESPIRATORY_TRACT | Status: DC | PRN

## 2013-07-31 MED ORDER — HYDROCODONE-ACETAMINOPHEN 7.5-325 MG PO TABS: 1.0000 | ORAL_TABLET | Freq: Four times a day (QID) | ORAL | Status: DC | PRN

## 2013-07-31 NOTE — Telephone Encounter (Signed)
Med(s) refilled, script printed out and waiting on signature for approval

## 2013-07-31 NOTE — Telephone Encounter (Signed)
?   Ok to refill hydrocodone, last refill 06/25/13,lov 05/31/13

## 2013-07-31 NOTE — Telephone Encounter (Signed)
Okay to refill both.

## 2013-07-31 NOTE — Addendum Note (Signed)
Addended by: Daylene Posey T on: 07/31/2013 02:13 PM   Modules accepted: Orders

## 2013-07-31 NOTE — Telephone Encounter (Signed)
refille inhaler waiting on approval from doctor to reill hydrocodone

## 2013-08-01 ENCOUNTER — Ambulatory Visit (INDEPENDENT_AMBULATORY_CARE_PROVIDER_SITE_OTHER): Payer: Medicare HMO | Admitting: Family Medicine

## 2013-08-01 ENCOUNTER — Encounter: Payer: Self-pay | Admitting: Family Medicine

## 2013-08-01 VITALS — BP 100/70 | HR 60 | Temp 97.7°F | Resp 18 | Ht 72.0 in | Wt 140.0 lb

## 2013-08-01 DIAGNOSIS — R1013 Epigastric pain: Secondary | ICD-10-CM

## 2013-08-01 DIAGNOSIS — E785 Hyperlipidemia, unspecified: Secondary | ICD-10-CM

## 2013-08-01 DIAGNOSIS — K3189 Other diseases of stomach and duodenum: Secondary | ICD-10-CM

## 2013-08-01 DIAGNOSIS — M549 Dorsalgia, unspecified: Secondary | ICD-10-CM

## 2013-08-01 DIAGNOSIS — Z79899 Other long term (current) drug therapy: Secondary | ICD-10-CM

## 2013-08-01 LAB — COMPREHENSIVE METABOLIC PANEL
ALT: 12 U/L (ref 0–53)
AST: 16 U/L (ref 0–37)
Albumin: 4.4 g/dL (ref 3.5–5.2)
Alkaline Phosphatase: 62 U/L (ref 39–117)
BILIRUBIN TOTAL: 0.5 mg/dL (ref 0.3–1.2)
BUN: 13 mg/dL (ref 6–23)
CO2: 26 mEq/L (ref 19–32)
CREATININE: 1.27 mg/dL (ref 0.50–1.35)
Calcium: 9.9 mg/dL (ref 8.4–10.5)
Chloride: 107 mEq/L (ref 96–112)
Glucose, Bld: 87 mg/dL (ref 70–99)
Potassium: 4.4 mEq/L (ref 3.5–5.3)
Sodium: 141 mEq/L (ref 135–145)
Total Protein: 7 g/dL (ref 6.0–8.3)

## 2013-08-01 LAB — LIPID PANEL
Cholesterol: 229 mg/dL — ABNORMAL HIGH (ref 0–200)
HDL: 58 mg/dL (ref >39)
LDL CALC: 155 mg/dL — AB (ref 0–99)
TRIGLYCERIDES: 82 mg/dL (ref <150)
Total CHOL/HDL Ratio: 3.9 Ratio
VLDL: 16 mg/dL (ref 0–40)

## 2013-08-01 MED ORDER — OMEPRAZOLE 40 MG PO CPDR: 40.0000 mg | DELAYED_RELEASE_CAPSULE | Freq: Every day | ORAL | Status: DC

## 2013-08-01 NOTE — Patient Instructions (Signed)
F/u 3 MONTHS FOR PHYSICAL Start new stomach medication We will send letter if labs normal Urine Drug screen today

## 2013-08-03 ENCOUNTER — Encounter: Payer: Self-pay | Admitting: Family Medicine

## 2013-08-03 NOTE — Assessment & Plan Note (Signed)
Would check fasting lipid panel currently on Zocor 10 the

## 2013-08-03 NOTE — Assessment & Plan Note (Signed)
Will change to proton pump inhibitor omeprazole

## 2013-08-03 NOTE — Assessment & Plan Note (Signed)
Pain contract. He needs to reschedule with neurosurgery, we have scheduled with multiple times for him Urine drug screen was obtained

## 2013-08-03 NOTE — Progress Notes (Signed)
   Subjective:    Patient ID: Charles Rivers, male    DOB: Dec 16, 1950, 63 y.o.   MRN: [id number]  HPI Patient here to follow chronic medical problems if her medications. He continues to have difficulty with his dyspepsia and heartburn. He is taking Zantac but still continues to have a sick feeling on the stomach after he eats and some nausea. He is due for recheck a lipid panel and liver function tests he is currently on Zocor 10 mg to Continues to have chronic back pain but is missed his appointment with neurosurgery   Review of Systems  GEN- denies fatigue, fever, weight loss,weakness, recent illness HEENT- denies eye drainage, change in vision, nasal discharge, CVS- denies chest pain, palpitations RESP- denies SOB, cough, wheeze ABD- denies N/V, change in stools, abd pain MSK- + joint pain, muscle aches, injury Neuro- denies headache, dizziness, syncope, seizure activity      Objective:   Physical Exam  GEN- NAD, alert and oriented x3 CVS- RRR, no murmur RESP-CTAB ABD-NABS,soft,NT,ND EXT- No edema Pulses- Radial 2+       Assessment & Plan:

## 2013-08-05 LAB — PRESCRIPTION MONITORING PROFILE (13 PANEL)
Amphetamine/Meth: NEGATIVE ng/mL
Barbiturate Screen, Urine: NEGATIVE ng/mL
Benzodiazepine Screen, Urine: NEGATIVE ng/mL
Buprenorphine, Urine: NEGATIVE ng/mL
Cocaine Metabolites: NEGATIVE ng/mL
Creatinine, Urine: 213.82 mg/dL (ref >20.0)
Fentanyl, Ur: NEGATIVE ng/mL
Meperidine, Ur: NEGATIVE ng/mL
Methadone Screen, Urine: NEGATIVE ng/mL
Nitrites, Initial: NEGATIVE ug/mL
Opiate Screen, Urine: NEGATIVE ng/mL
Oxycodone Screen, Ur: NEGATIVE ng/mL
Propoxyphene: NEGATIVE ng/mL
Tramadol Scrn, Ur: NEGATIVE ng/mL
pH, Initial: 6.5 pH (ref 4.5–8.9)

## 2013-08-05 LAB — CANNABANOIDS (GC/LC/MS), URINE

## 2013-08-08 MED ORDER — SIMVASTATIN 20 MG PO TABS: 20.0000 mg | ORAL_TABLET | Freq: Every day | ORAL | Status: DC

## 2013-08-08 NOTE — Addendum Note (Signed)
Addended by: Daylene Posey T on: 08/08/2013 10:28 AM   Modules accepted: Orders

## 2013-08-20 ENCOUNTER — Ambulatory Visit: Payer: Self-pay | Admitting: Family Medicine

## 2013-08-26 ENCOUNTER — Ambulatory Visit: Payer: Self-pay | Admitting: Family Medicine

## 2013-09-02 ENCOUNTER — Ambulatory Visit: Payer: Medicare HMO | Admitting: Family Medicine

## 2013-10-01 ENCOUNTER — Ambulatory Visit (INDEPENDENT_AMBULATORY_CARE_PROVIDER_SITE_OTHER): Payer: Medicare HMO | Admitting: Family Medicine

## 2013-10-01 ENCOUNTER — Encounter: Payer: Self-pay | Admitting: Family Medicine

## 2013-10-01 VITALS — BP 138/70 | HR 60 | Temp 97.8°F | Resp 16 | Ht 71.0 in | Wt 137.0 lb

## 2013-10-01 DIAGNOSIS — Z79899 Other long term (current) drug therapy: Secondary | ICD-10-CM

## 2013-10-01 DIAGNOSIS — F1210 Cannabis abuse, uncomplicated: Secondary | ICD-10-CM

## 2013-10-01 DIAGNOSIS — B351 Tinea unguium: Secondary | ICD-10-CM

## 2013-10-01 DIAGNOSIS — M549 Dorsalgia, unspecified: Secondary | ICD-10-CM

## 2013-10-01 MED ORDER — EFINACONAZOLE 10 % EX SOLN: CUTANEOUS | Status: DC

## 2013-10-01 MED ORDER — TRAMADOL HCL 50 MG PO TABS: ORAL_TABLET | ORAL | Status: DC

## 2013-10-01 MED ORDER — TERBINAFINE HCL 250 MG PO TABS: 250.0000 mg | ORAL_TABLET | Freq: Every day | ORAL | Status: DC

## 2013-10-01 NOTE — Assessment & Plan Note (Signed)
He felt his drug screen as there was no hydrocodone in his urine. I've given him a prescription for tramadol he can use this for pain otherwise he can take over-the-counter medication. He also can reschedule his multiple missed appointments with neurosurgery if he wants further relief for his back pain

## 2013-10-01 NOTE — Patient Instructions (Signed)
Use ultram for back pain No further narcotic medication from my office Jublia sent for your nail fungus CHANGE F/U TO A 30 MINUTE SLOT FOR PHYSICAL

## 2013-10-01 NOTE — Progress Notes (Signed)
Patient ID: ISSACC MERLO, male   DOB: 03/21/1951, 63 y.o.   MRN: [id number]   Subjective:    Patient ID: MIZRAIM HARMENING, male    DOB: [birth year], 63 y.o.   MRN: [id number]  Patient presents for R great toe nail and Medication review   patient here to followup last set of labs. His cholesterol was elevated therefore his Zocor was increased to 20 mg. His urine drug screen was also obtained and was negative for any opiates that he is prescribed hydrocodone on a regular basis. His drug screen also showed large amount of marijuana. He agrees to smoking marijuana but states that he was out of pain medications I pulled up his chart and show him that he had picked up a prescription for his hydrocodone 6 days before his drug test that there is no way that he could of been out of medications and he cannot explain this to me.  He denies given others his pain medication was selling the pain medication.   He also complained of some nail changes and pain on his great toenail on the right foot for the past month or so. He states it is painful it has been yellow and he was worried about fungus.    Review Of Systems:  GEN- denies fatigue, fever, weight loss,weakness, recent illness HEENT- denies eye drainage, change in vision, nasal discharge, CVS- denies chest pain, palpitations RESP- denies SOB, cough, wheeze MSK- + joint pain, muscle aches, injury Neuro- denies headache, dizziness, syncope, seizure activity       Objective:    BP 138/70  Pulse 60  Temp(Src) 97.8 F (36.6 C) (Oral)  Resp 16  Ht 5\' 11"  (1.803 m)  Wt 137 lb (62.143 kg)  BMI 19.12 kg/m2 GEN- NAD, alert and oriented x3 Skin- Toenails thickened with discoloriation, Right great toenail- very thick with yellow and black streaks, TTP over nail, no ingrown nail, no erythema, Great toe non tender with palpation Pulses- Radial, DP- 2+        Assessment & Plan:      Problem List Items Addressed This Visit   None      Note: This  dictation was prepared with Dragon dictation along with smaller phrase technology. Any transcriptional errors that result from this process are unintentional.

## 2013-10-01 NOTE — Assessment & Plan Note (Signed)
There were found in high amounts in his urine which is also violation of his pain contract. He's advised that he would not be prescribed any further narcotic medication

## 2013-10-01 NOTE — Assessment & Plan Note (Signed)
jublia was not covered, discussed he may need the oral terbinafine, LFT in 6 weeks

## 2013-10-02 ENCOUNTER — Other Ambulatory Visit: Payer: Self-pay | Admitting: *Deleted

## 2013-10-02 MED ORDER — OMEPRAZOLE 40 MG PO CPDR: 40.0000 mg | DELAYED_RELEASE_CAPSULE | Freq: Every day | ORAL | Status: DC

## 2013-10-02 MED ORDER — GABAPENTIN 300 MG PO CAPS: 300.0000 mg | ORAL_CAPSULE | Freq: Three times a day (TID) | ORAL | Status: DC

## 2013-10-02 MED ORDER — ALBUTEROL SULFATE HFA 108 (90 BASE) MCG/ACT IN AERS: 1.0000 | INHALATION_SPRAY | Freq: Four times a day (QID) | RESPIRATORY_TRACT | Status: DC | PRN

## 2013-10-02 NOTE — Telephone Encounter (Signed)
Message copied by Sheral Flow on Thu Oct 02, 2013  8:21 AM ------      Message from: Vic Blackbird F      Created: Wed Oct 01, 2013  8:27 PM      Regarding: Medication change              Call pt, jublia not covered, he will have to use oral medication 1 tablet daily- terbinafine, called in      He needs to have his liver checked in 6 weeks ------

## 2013-10-02 NOTE — Telephone Encounter (Signed)
Call placed to patient and patient made aware.   Reported that he requires refills on Prilosec, albuterol inhaler and gabapentin.   Refill appropriate and filled per protocol.

## 2013-10-31 ENCOUNTER — Ambulatory Visit (INDEPENDENT_AMBULATORY_CARE_PROVIDER_SITE_OTHER): Payer: Medicare HMO | Admitting: Family Medicine

## 2013-10-31 ENCOUNTER — Encounter: Payer: Self-pay | Admitting: Family Medicine

## 2013-10-31 VITALS — BP 130/78 | HR 64 | Temp 97.9°F | Resp 14 | Ht 71.0 in | Wt 135.0 lb

## 2013-10-31 DIAGNOSIS — R110 Nausea: Secondary | ICD-10-CM

## 2013-10-31 DIAGNOSIS — E785 Hyperlipidemia, unspecified: Secondary | ICD-10-CM

## 2013-10-31 DIAGNOSIS — K219 Gastro-esophageal reflux disease without esophagitis: Secondary | ICD-10-CM

## 2013-10-31 LAB — CBC WITH DIFFERENTIAL/PLATELET
BASOS ABS: 0 10*3/uL (ref 0.0–0.1)
Basophils Relative: 1 % (ref 0–1)
EOS PCT: 4 % (ref 0–5)
Eosinophils Absolute: 0.2 10*3/uL (ref 0.0–0.7)
HCT: 39.4 % (ref 39.0–52.0)
Hemoglobin: 13.6 g/dL (ref 13.0–17.0)
Lymphocytes Relative: 30 % (ref 12–46)
Lymphs Abs: 1.3 10*3/uL (ref 0.7–4.0)
MCH: 29.1 pg (ref 26.0–34.0)
MCHC: 34.5 g/dL (ref 30.0–36.0)
MCV: 84.2 fL (ref 78.0–100.0)
Monocytes Absolute: 0.2 10*3/uL (ref 0.1–1.0)
Monocytes Relative: 5 % (ref 3–12)
NEUTROS ABS: 2.6 10*3/uL (ref 1.7–7.7)
Neutrophils Relative %: 60 % (ref 43–77)
PLATELETS: 191 10*3/uL (ref 150–400)
RBC: 4.68 MIL/uL (ref 4.22–5.81)
RDW: 14.7 % (ref 11.5–15.5)
WBC: 4.3 10*3/uL (ref 4.0–10.5)

## 2013-10-31 LAB — COMPREHENSIVE METABOLIC PANEL
ALT: 10 U/L (ref 0–53)
AST: 17 U/L (ref 0–37)
Albumin: 4 g/dL (ref 3.5–5.2)
Alkaline Phosphatase: 59 U/L (ref 39–117)
BUN: 11 mg/dL (ref 6–23)
CALCIUM: 9.5 mg/dL (ref 8.4–10.5)
CHLORIDE: 105 meq/L (ref 96–112)
CO2: 29 mEq/L (ref 19–32)
CREATININE: 1.06 mg/dL (ref 0.50–1.35)
Glucose, Bld: 82 mg/dL (ref 70–99)
POTASSIUM: 4.6 meq/L (ref 3.5–5.3)
Sodium: 141 mEq/L (ref 135–145)
Total Bilirubin: 0.4 mg/dL (ref 0.2–1.2)
Total Protein: 6.7 g/dL (ref 6.0–8.3)

## 2013-10-31 LAB — LIPID PANEL
Cholesterol: 192 mg/dL (ref 0–200)
HDL: 56 mg/dL (ref >39)
LDL Cholesterol: 121 mg/dL — ABNORMAL HIGH (ref 0–99)
Total CHOL/HDL Ratio: 3.4 Ratio
Triglycerides: 77 mg/dL (ref <150)
VLDL: 15 mg/dL (ref 0–40)

## 2013-10-31 MED ORDER — OMEPRAZOLE 40 MG PO CPDR: 40.0000 mg | DELAYED_RELEASE_CAPSULE | Freq: Every day | ORAL | Status: DC

## 2013-10-31 NOTE — Assessment & Plan Note (Signed)
I think this is mostly acid reflux I given him some samples of Nexium until he is well today his prescription from the pharmacy. He will then restart his omeprazole 40 mg once a day. Secondary to his NSAID use and vomiting associated I will go ahead and get an H. pylori as well.

## 2013-10-31 NOTE — Patient Instructions (Signed)
Restart omperazole Nexium sample given until you get the omeprazole Continue all other medications We will call with results F/U 4 months

## 2013-10-31 NOTE — Progress Notes (Signed)
Patient ID: Charles Rivers, male   DOB: [birth year], 63 y.o.   MRN: [id number]   Subjective:    Patient ID: Charles Rivers, male    DOB: May 14, 1951, 63 y.o.   MRN: [id number]  Patient presents for 3 month F/U and Nausea  patient here to followup medications. At her last visit I resolved his pain contract secondary to failed drug test. 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 He has had some nausea on and off for the past month or so. He's had some emesis with it when he feels everything going up his chest. He has not been taking his omeprazole which was prescribed in the past. He does not have any abdominal pain no change in his bowel movements. There no particular foods that make his symptoms worse, has taken some over-the-counter NSAIDs   He is taking the Lamisil for his nail fungus due for repeat liver function tests    Review Of Systems:  GEN- denies fatigue, fever, weight loss,weakness, recent illness HEENT- denies eye drainage, change in vision, nasal discharge, CVS- denies chest pain, palpitations RESP- denies SOB, cough, wheeze ABD- + N/V, change in stools, abd pain GU- denies dysuria, hematuria, dribbling, incontinence MSK- denies joint pain, muscle aches, injury Neuro- denies headache, dizziness, syncope, seizure activity       Objective:    BP 130/78  Pulse 64  Temp(Src) 97.9 F (36.6 C) (Oral)  Resp 14  Ht 5\' 11"  (1.803 m)  Wt 135 lb (61.236 kg)  BMI 18.84 kg/m2 GEN- NAD, alert and oriented x3 HEENT- PERRL, EOMI, non injected sclera, pink conjunctiva, MMM, oropharynx clear Neck- Supple, no LAD CVS- RRR, no murmur RESP-CTAB ABD-NABS,soft,NT,ND EXT- No edema Pulses- Radial 2+        Assessment & Plan:      Problem List Items Addressed This Visit   Nausea   Relevant Orders      CBC with Differential      Comprehensive  metabolic panel      Helicobacter pylori abs-IgG+IgA, bld   GERD (gastroesophageal reflux disease) - Primary   Relevant Medications      omeprazole (PRILOSEC) capsule   Other Relevant Orders      Helicobacter pylori abs-IgG+IgA, bld    Other Visit Diagnoses   Other and unspecified hyperlipidemia        Relevant Orders       Lipid panel       Note: This dictation was prepared with Dragon dictation along with smaller phrase technology. Any transcriptional errors that result from this process are unintentional.

## 2013-10-31 NOTE — Assessment & Plan Note (Signed)
Per above labs as well as PPI to be started

## 2013-11-04 LAB — HELICOBACTER PYLORI ABS-IGG+IGA, BLD
H PYLORI IGG: 4.91 {ISR} — AB
HELICOBACTER PYLORI AB, IGA: 22.2 U/mL — AB (ref <9.0)

## 2013-11-06 ENCOUNTER — Telehealth: Payer: Self-pay | Admitting: Family Medicine

## 2013-11-06 MED ORDER — CLARITHROMYCIN 500 MG PO TABS: 500.0000 mg | ORAL_TABLET | Freq: Two times a day (BID) | ORAL | Status: DC

## 2013-11-06 MED ORDER — AMOXICILLIN 500 MG PO CAPS: 1000.0000 mg | ORAL_CAPSULE | Freq: Two times a day (BID) | ORAL | Status: DC

## 2013-11-06 NOTE — Telephone Encounter (Signed)
LMTRC

## 2013-11-06 NOTE — Telephone Encounter (Signed)
Call pt his cholesteorl is much better  However he has the H pylori infection causing his nauea and epigastric pain  He needs to take the 2 antibiotics Amoxicillin and clarithromycin that I sent over to pharmacy Take with his prilosec He can hold on the cholesterol medication until after he completes antibiotics then restart

## 2013-11-07 NOTE — Telephone Encounter (Signed)
LMTRC

## 2013-11-10 NOTE — Telephone Encounter (Signed)
Patient came in to office and was made aware.

## 2014-01-22 ENCOUNTER — Other Ambulatory Visit: Payer: Self-pay | Admitting: Family Medicine

## 2014-11-08 ENCOUNTER — Encounter (HOSPITAL_COMMUNITY): Payer: Self-pay | Admitting: Emergency Medicine

## 2014-11-08 ENCOUNTER — Emergency Department (HOSPITAL_COMMUNITY)
Admission: EM | Admit: 2014-11-08 | Discharge: 2014-11-08 | Disposition: A | Discharge status: Home / Self Care | Payer: Commercial Managed Care - HMO | Attending: Emergency Medicine | Admitting: Emergency Medicine

## 2014-11-08 DIAGNOSIS — R0981 Nasal congestion: Secondary | ICD-10-CM | POA: Insufficient documentation

## 2014-11-08 DIAGNOSIS — M791 Myalgia: Secondary | ICD-10-CM | POA: Insufficient documentation

## 2014-11-08 DIAGNOSIS — R059 Cough, unspecified: Secondary | ICD-10-CM

## 2014-11-08 DIAGNOSIS — R05 Cough: Secondary | ICD-10-CM

## 2014-11-08 DIAGNOSIS — R110 Nausea: Secondary | ICD-10-CM | POA: Insufficient documentation

## 2014-11-08 DIAGNOSIS — J45901 Unspecified asthma with (acute) exacerbation: Secondary | ICD-10-CM | POA: Diagnosis not present

## 2014-11-08 DIAGNOSIS — Z792 Long term (current) use of antibiotics: Secondary | ICD-10-CM | POA: Insufficient documentation

## 2014-11-08 DIAGNOSIS — E785 Hyperlipidemia, unspecified: Secondary | ICD-10-CM | POA: Insufficient documentation

## 2014-11-08 DIAGNOSIS — Z79899 Other long term (current) drug therapy: Secondary | ICD-10-CM | POA: Diagnosis not present

## 2014-11-08 DIAGNOSIS — G8929 Other chronic pain: Secondary | ICD-10-CM | POA: Diagnosis not present

## 2014-11-08 DIAGNOSIS — J3489 Other specified disorders of nose and nasal sinuses: Secondary | ICD-10-CM | POA: Diagnosis not present

## 2014-11-08 DIAGNOSIS — R6883 Chills (without fever): Secondary | ICD-10-CM | POA: Diagnosis not present

## 2014-11-08 DIAGNOSIS — Z87891 Personal history of nicotine dependence: Secondary | ICD-10-CM | POA: Insufficient documentation

## 2014-11-08 MED ORDER — GUAIFENESIN-CODEINE 100-10 MG/5ML PO SYRP: 10.0000 mL | ORAL_SOLUTION | Freq: Three times a day (TID) | ORAL | Status: DC | PRN

## 2014-11-08 MED ORDER — ALBUTEROL SULFATE HFA 108 (90 BASE) MCG/ACT IN AERS: 1.0000 | INHALATION_SPRAY | Freq: Four times a day (QID) | RESPIRATORY_TRACT | Status: DC | PRN

## 2014-11-08 MED ORDER — AZITHROMYCIN 250 MG PO TABS: 250.0000 mg | ORAL_TABLET | Freq: Every day | ORAL | Status: DC

## 2014-11-08 NOTE — ED Notes (Addendum)
Patient c/o productive cough, nasal congestion, sinus pressure, generalized body aches, and nausea that started yesterday. Denies any fever, vomiting, or diarrhea. Temp 100 in triage. Patient reports sputum as thick white-yellow.

## 2014-11-08 NOTE — ED Provider Notes (Signed)
CSN: [id number]     Arrival date & time 11/08/14  1337 History  This chart was scribed for non-physician practitioner Kem Parkinson, PA-C working with Daleen Bo, MD by Zola Button, ED Scribe. This patient was seen in room APFT24/APFT24 and the patient's care was started at 2:31 PM.     Chief Complaint  Patient presents with  . Cough   The history is provided by the patient. No language interpreter was used.   HPI Comments: Charles Rivers is a 64 y.o. male who presents to the Emergency Department complaining of gradual onset URI symptoms that started yesterday. Reports nasal congestion, cough and body aches.  Reports cough is productive.  She has not tried any medications for symptom relief prior to arrival.  She denies fever, vomiting, chest pain or shortness of breath.     Past Medical History  Diagnosis Date  . Asthma   . Chronic back pain   . Hyperlipidemia    Past Surgical History  Procedure Laterality Date  . Right middle finger reconstruction     Family History  Problem Relation Age of Onset  . Diabetes Mother   . Cancer Mother     breast  . Cancer Father   . Asthma Brother    History  Substance Use Topics  . Smoking status: Former Research scientist (life sciences)  . Smokeless tobacco: Never Used  . Alcohol Use: Yes     Comment: 2-3 beers a week    Review of Systems  Constitutional: Positive for chills. Negative for fever.  HENT: Positive for congestion and sinus pressure. Negative for trouble swallowing.   Respiratory: Positive for cough. Negative for shortness of breath and wheezing.   Gastrointestinal: Positive for nausea. Negative for vomiting and diarrhea.  Genitourinary: Negative for dysuria.  Musculoskeletal: Positive for myalgias.  Neurological: Negative for dizziness, weakness and headaches.  All other systems reviewed and are negative.     Allergies  Review of patient's allergies indicates no known allergies.  Home Medications   Prior to Admission medications    Medication Sig Start Date End Date Taking? Authorizing Provider  albuterol (PROVENTIL HFA;VENTOLIN HFA) 108 (90 BASE) MCG/ACT inhaler Inhale 1-2 puffs into the lungs every 6 (six) hours as needed for wheezing. 10/02/13   Alycia Rossetti, MD  amoxicillin (AMOXIL) 500 MG capsule Take 2 capsules (1,000 mg total) by mouth 2 (two) times daily. For 10 days 11/06/13   Alycia Rossetti, MD  aspirin 325 MG tablet Take 325 mg by mouth every 6 (six) hours as needed for moderate pain.    Historical Provider, MD  clarithromycin (BIAXIN) 500 MG tablet Take 1 tablet (500 mg total) by mouth 2 (two) times daily. 11/06/13   Alycia Rossetti, MD  gabapentin (NEURONTIN) 300 MG capsule Take 1-2 capsules (300-600 mg total) by mouth 3 (three) times daily. Takes 300mg  in morning, 300mg  in evening and 600mg  at bedtime 10/02/13   Alycia Rossetti, MD  omeprazole (PRILOSEC) 40 MG capsule Take 1 capsule (40 mg total) by mouth daily. 10/31/13   Alycia Rossetti, MD  sildenafil (VIAGRA) 50 MG tablet Take 1 tablet (50 mg total) by mouth daily as needed for erectile dysfunction. 06/25/13   Alycia Rossetti, MD  simvastatin (ZOCOR) 20 MG tablet Take 1 tablet (20 mg total) by mouth at bedtime. 08/08/13   Alycia Rossetti, MD  terbinafine (LAMISIL) 250 MG tablet Take 1 tablet (250 mg total) by mouth daily. 10/01/13   Alycia Rossetti, MD  traMADol (ULTRAM) 50 MG tablet TAKE 1-2 Tablets every 6 hours as needed for pain 10/01/13   Alycia Rossetti, MD   BP 141/75 mmHg  Pulse 64  Temp(Src) 100 F (37.8 C) (Oral)  Resp 18  Ht 6' (1.829 m)  Wt 140 lb (63.504 kg)  BMI 18.98 kg/m2  SpO2 100% Physical Exam  Constitutional: He is oriented to person, place, and time. He appears well-developed and well-nourished. No distress.  HENT:  Head: Normocephalic and atraumatic.  Right Ear: Tympanic membrane and ear canal normal.  Left Ear: Tympanic membrane and ear canal normal.  Mouth/Throat: Uvula is midline, oropharynx is clear and moist and  mucous membranes are normal. No oropharyngeal exudate.  Eyes: EOM are normal. Pupils are equal, round, and reactive to light.  Neck: Normal range of motion, full passive range of motion without pain and phonation normal. Neck supple.  Cardiovascular: Normal rate, regular rhythm, normal heart sounds and intact distal pulses.   No murmur heard. Pulmonary/Chest: Effort normal. No stridor. No respiratory distress. He has wheezes. He has no rales. He exhibits no tenderness.  Coarse lungs sounds bilaterally with occasional inspiratory wheezes.  No rales  Musculoskeletal: Normal range of motion. He exhibits no edema.  Lymphadenopathy:    He has no cervical adenopathy.  Neurological: He is alert and oriented to person, place, and time. No cranial nerve deficit. He exhibits normal muscle tone. Coordination normal.  Skin: Skin is warm and dry. No rash noted.  Psychiatric: He has a normal mood and affect. His behavior is normal.  Nursing note and vitals reviewed.   ED Course  Procedures  DIAGNOSTIC STUDIES: Oxygen Saturation is 100% on room air, normal by my interpretation.    COORDINATION OF CARE: 2:30 PM-Discussed treatment plan with patient/guardian at bedside and patient/guardian agreed to plan.    Labs Review Labs Reviewed - No data to display  Imaging Review No results found.   EKG Interpretation None      MDM   Final diagnoses:  Cough    Pt is well appearing.  Vitals are stable.  Non-toxic appearing.  Agrees to close PMD f/u or to return here for any worsening sx's  I personally performed the services described in this documentation, which was scribed in my presence. The recorded information has been reviewed and is accurate.    Kem Parkinson, PA-C 11/10/14 1831  Daleen Bo, MD 11/12/14 [id number]

## 2014-11-08 NOTE — Discharge Instructions (Signed)
Cough, Adult   A cough is a reflex. It helps you clear your throat and airways. A cough can help heal your body. A cough can last 2 or 3 weeks (acute) or may last more than 8 weeks (chronic). Some common causes of a cough can include an infection, allergy, or a cold.  HOME CARE  · Only take medicine as told by your doctor.  · If given, take your medicines (antibiotics) as told. Finish them even if you start to feel better.  · Use a cold steam vaporizer or humidifier in your home. This can help loosen thick spit (secretions).  · Sleep so you are almost sitting up (semi-upright). Use pillows to do this. This helps reduce coughing.  · Rest as needed.  · Stop smoking if you smoke.  GET HELP RIGHT AWAY IF:  · You have yellowish-white fluid (pus) in your thick spit.  · Your cough gets worse.  · Your medicine does not reduce coughing, and you are losing sleep.  · You cough up blood.  · You have trouble breathing.  · Your pain gets worse and medicine does not help.  · You have a fever.  MAKE SURE YOU:   · Understand these instructions.  · Will watch your condition.  · Will get help right away if you are not doing well or get worse.  Document Released: 03/09/2011 Document Revised: 11/10/2013 Document Reviewed: 03/09/2011  ExitCare® Patient Information ©2015 [employer]. This information is not intended to replace advice given to you by your health care provider. Make sure you discuss any questions you have with your health care provider.

## 2016-07-26 ENCOUNTER — Encounter (HOSPITAL_COMMUNITY): Payer: Self-pay | Admitting: Emergency Medicine

## 2016-07-26 ENCOUNTER — Emergency Department (HOSPITAL_COMMUNITY)
Admission: EM | Admit: 2016-07-26 | Discharge: 2016-07-26 | Disposition: A | Discharge status: Home / Self Care | Payer: Medicare HMO | Attending: Emergency Medicine | Admitting: Emergency Medicine

## 2016-07-26 DIAGNOSIS — J069 Acute upper respiratory infection, unspecified: Secondary | ICD-10-CM

## 2016-07-26 DIAGNOSIS — Z87891 Personal history of nicotine dependence: Secondary | ICD-10-CM | POA: Insufficient documentation

## 2016-07-26 DIAGNOSIS — Z7982 Long term (current) use of aspirin: Secondary | ICD-10-CM | POA: Insufficient documentation

## 2016-07-26 DIAGNOSIS — Z79899 Other long term (current) drug therapy: Secondary | ICD-10-CM | POA: Insufficient documentation

## 2016-07-26 DIAGNOSIS — J45909 Unspecified asthma, uncomplicated: Secondary | ICD-10-CM | POA: Diagnosis not present

## 2016-07-26 DIAGNOSIS — R0981 Nasal congestion: Secondary | ICD-10-CM | POA: Diagnosis present

## 2016-07-26 MED ORDER — ONDANSETRON HCL 8 MG PO TABS: 8.0000 mg | ORAL_TABLET | ORAL | 0 refills | Status: DC | PRN

## 2016-07-26 MED ORDER — PREDNISONE 10 MG PO TABS: 20.0000 mg | ORAL_TABLET | Freq: Every day | ORAL | 0 refills | Status: DC

## 2016-07-26 MED ORDER — ALBUTEROL SULFATE HFA 108 (90 BASE) MCG/ACT IN AERS: 1.0000 | INHALATION_SPRAY | Freq: Four times a day (QID) | RESPIRATORY_TRACT | 0 refills | Status: DC | PRN

## 2016-07-26 NOTE — ED Provider Notes (Signed)
[facility] DEPT Provider Note   CSN: [id number] Arrival date & time: 07/26/16  1422     History   Chief Complaint Chief Complaint  Patient presents with  . bodyache    HPI Charles Rivers is a 66 y.o. male.  Cough for 3-4 days with body aches and nasal congestion. No fever, sweats, chills. Patient is ambulatory. Good oral intake. Severity of symptoms mild to moderate.      Past Medical History:  Diagnosis Date  . Asthma   . Chronic back pain   . Hyperlipidemia     Patient Active Problem List   Diagnosis Date Noted  . GERD (gastroesophageal reflux disease) 10/31/2013  . Nausea 10/31/2013  . Nail fungus 10/01/2013  . Hyperlipidemia 01/08/2013  . Bulging lumbar disc 01/08/2013  . Positive fecal occult blood test 01/08/2013  . Marijuana abuse 06/21/2012  . Lipoma 08/22/2011  . Dyspepsia 04/30/2011  . Asthma 02/15/2011  . Back pain with radiation 02/15/2011  . Erectile dysfunction 02/15/2011    Past Surgical History:  Procedure Laterality Date  . Right middle finger reconstruction         Home Medications    Prior to Admission medications   Medication Sig Start Date End Date Taking? Authorizing Provider  albuterol (PROVENTIL HFA;VENTOLIN HFA) 108 (90 Base) MCG/ACT inhaler Inhale 1-2 puffs into the lungs every 6 (six) hours as needed for wheezing or shortness of breath. 07/26/16   Nat Christen, MD  amoxicillin (AMOXIL) 500 MG capsule Take 2 capsules (1,000 mg total) by mouth 2 (two) times daily. For 10 days 11/06/13   Alycia Rossetti, MD  aspirin 325 MG tablet Take 325 mg by mouth every 6 (six) hours as needed for moderate pain.    Historical Provider, MD  azithromycin (ZITHROMAX) 250 MG tablet Take 1 tablet (250 mg total) by mouth daily. Take first 2 tablets together, then 1 every day until finished. 11/08/14   Tammy Triplett, PA-C  clarithromycin (BIAXIN) 500 MG tablet Take 1 tablet (500 mg total) by mouth 2 (two) times daily. 11/06/13   Alycia Rossetti, MD    gabapentin (NEURONTIN) 300 MG capsule Take 1-2 capsules (300-600 mg total) by mouth 3 (three) times daily. Takes 300mg  in morning, 300mg  in evening and 600mg  at bedtime 10/02/13   Alycia Rossetti, MD  guaiFENesin-codeine [facility]) 100-10 MG/5ML syrup Take 10 mLs by mouth 3 (three) times daily as needed. 11/08/14   Tammy Triplett, PA-C  omeprazole (PRILOSEC) 40 MG capsule Take 1 capsule (40 mg total) by mouth daily. 10/31/13   Alycia Rossetti, MD  ondansetron (ZOFRAN) 8 MG tablet Take 1 tablet (8 mg total) by mouth every 4 (four) hours as needed. 07/26/16   Nat Christen, MD  predniSONE (DELTASONE) 10 MG tablet Take 2 tablets (20 mg total) by mouth daily. 07/26/16   Nat Christen, MD  sildenafil (VIAGRA) 50 MG tablet Take 1 tablet (50 mg total) by mouth daily as needed for erectile dysfunction. 06/25/13   Alycia Rossetti, MD  simvastatin (ZOCOR) 20 MG tablet Take 1 tablet (20 mg total) by mouth at bedtime. 08/08/13   Alycia Rossetti, MD  terbinafine (LAMISIL) 250 MG tablet Take 1 tablet (250 mg total) by mouth daily. 10/01/13   Alycia Rossetti, MD  traMADol Veatrice Bourbon) 50 MG tablet TAKE 1-2 Tablets every 6 hours as needed for pain 10/01/13   Alycia Rossetti, MD    Family History Family History  Problem Relation Age of Onset  .  Diabetes Mother   . Cancer Mother     breast  . Cancer Father   . Asthma Brother     Social History Social History  Substance Use Topics  . Smoking status: Former Smoker    Quit date: 07/27/1967  . Smokeless tobacco: Never Used  . Alcohol use Yes     Comment: 2-3 beers a week     Allergies   Patient has no known allergies.   Review of Systems Review of Systems  All other systems reviewed and are negative.    Physical Exam Updated Vital Signs BP 148/80 (BP Location: Left Arm)   Pulse 75   Temp 98.1 F (36.7 C) (Oral)   Resp 20   Ht 6' (1.829 m)   Wt 145 lb (65.8 kg)   BMI 19.67 kg/m   Physical Exam  Constitutional: He is oriented to person,  place, and time. He appears well-developed and well-nourished.  HENT:  Head: Normocephalic and atraumatic.  Clear rhinorrhea  Eyes: Conjunctivae are normal.  Neck: Neck supple.  Cardiovascular: Normal rate and regular rhythm.   Pulmonary/Chest: Effort normal and breath sounds normal.  Abdominal: Soft. Bowel sounds are normal.  Musculoskeletal: Normal range of motion.  Neurological: He is alert and oriented to person, place, and time.  Skin: Skin is warm and dry.  Psychiatric: He has a normal mood and affect. His behavior is normal.  Nursing note and vitals reviewed.    ED Treatments / Results  Labs (all labs ordered are listed, but only abnormal results are displayed) Labs Reviewed - No data to display  EKG  EKG Interpretation None       Radiology No results found.  Procedures Procedures (including critical care time)  Medications Ordered in ED Medications - No data to display   Initial Impression / Assessment and Plan / ED Course  I have reviewed the triage vital signs and the nursing notes.  Pertinent labs & imaging results that were available during my care of the patient were reviewed by me and considered in my medical decision making (see chart for details).  Clinical Course     History and physical most consistent with viral URI. Patient is nontoxic-appearing. Discharge medications albuterol inhaler, prednisone, Zofran 8 mg  Final Clinical Impressions(s) / ED Diagnoses   Final diagnoses:  Viral URI    New Prescriptions New Prescriptions   ALBUTEROL (PROVENTIL HFA;VENTOLIN HFA) 108 (90 BASE) MCG/ACT INHALER    Inhale 1-2 puffs into the lungs every 6 (six) hours as needed for wheezing or shortness of breath.   ONDANSETRON (ZOFRAN) 8 MG TABLET    Take 1 tablet (8 mg total) by mouth every 4 (four) hours as needed.   PREDNISONE (DELTASONE) 10 MG TABLET    Take 2 tablets (20 mg total) by mouth daily.     Nat Christen, MD 07/26/16 1534

## 2016-07-26 NOTE — Discharge Instructions (Signed)
Prescription for inhaler, nausea medication and prednisone pills. Your insurance did not cover a steroid inhaler. Increase fluids. Tylenol. Rest.

## 2016-07-26 NOTE — ED Triage Notes (Signed)
bodyache for last 2-3 days, rated 6/10.  Chest congestion, yellow secretions.  Runny nose.

## 2016-09-11 DIAGNOSIS — J4530 Mild persistent asthma, uncomplicated: Secondary | ICD-10-CM | POA: Diagnosis not present

## 2016-09-11 DIAGNOSIS — Z79899 Other long term (current) drug therapy: Secondary | ICD-10-CM | POA: Diagnosis not present

## 2016-09-11 DIAGNOSIS — K219 Gastro-esophageal reflux disease without esophagitis: Secondary | ICD-10-CM | POA: Diagnosis not present

## 2016-09-11 DIAGNOSIS — J449 Chronic obstructive pulmonary disease, unspecified: Secondary | ICD-10-CM | POA: Diagnosis not present

## 2016-09-11 DIAGNOSIS — M549 Dorsalgia, unspecified: Secondary | ICD-10-CM | POA: Diagnosis not present

## 2016-10-11 DIAGNOSIS — K219 Gastro-esophageal reflux disease without esophagitis: Secondary | ICD-10-CM | POA: Diagnosis not present

## 2016-10-11 DIAGNOSIS — J4531 Mild persistent asthma with (acute) exacerbation: Secondary | ICD-10-CM | POA: Diagnosis not present

## 2016-12-11 DIAGNOSIS — F17210 Nicotine dependence, cigarettes, uncomplicated: Secondary | ICD-10-CM | POA: Diagnosis not present

## 2016-12-11 DIAGNOSIS — K219 Gastro-esophageal reflux disease without esophagitis: Secondary | ICD-10-CM | POA: Diagnosis not present

## 2016-12-11 DIAGNOSIS — J4530 Mild persistent asthma, uncomplicated: Secondary | ICD-10-CM | POA: Diagnosis not present

## 2016-12-11 DIAGNOSIS — F17209 Nicotine dependence, unspecified, with unspecified nicotine-induced disorders: Secondary | ICD-10-CM | POA: Diagnosis not present

## 2016-12-11 DIAGNOSIS — Z1389 Encounter for screening for other disorder: Secondary | ICD-10-CM | POA: Diagnosis not present

## 2017-01-18 DIAGNOSIS — M549 Dorsalgia, unspecified: Secondary | ICD-10-CM | POA: Diagnosis not present

## 2017-01-18 DIAGNOSIS — J4530 Mild persistent asthma, uncomplicated: Secondary | ICD-10-CM | POA: Diagnosis not present

## 2017-04-23 DIAGNOSIS — F17209 Nicotine dependence, unspecified, with unspecified nicotine-induced disorders: Secondary | ICD-10-CM | POA: Diagnosis not present

## 2017-04-23 DIAGNOSIS — K219 Gastro-esophageal reflux disease without esophagitis: Secondary | ICD-10-CM | POA: Diagnosis not present

## 2017-04-23 DIAGNOSIS — J4530 Mild persistent asthma, uncomplicated: Secondary | ICD-10-CM | POA: Diagnosis not present

## 2017-04-23 DIAGNOSIS — Z23 Encounter for immunization: Secondary | ICD-10-CM | POA: Diagnosis not present

## 2017-05-16 ENCOUNTER — Telehealth: Payer: Self-pay

## 2017-05-16 NOTE — Telephone Encounter (Signed)
PCP office called to follow up on triage referral. I told her letter was sent to patient back in June and verified address. Pt was calling PCP to say that he hasn't heard from [place]. Pt is going to call this afternoon.

## 2017-05-22 ENCOUNTER — Telehealth: Payer: Self-pay

## 2017-05-22 NOTE — Telephone Encounter (Signed)
[phone]  Patient receiver letter to schedule tcs, no current gi or cardiac issues

## 2017-05-23 NOTE — Telephone Encounter (Signed)
LM for pt to call

## 2017-05-24 ENCOUNTER — Ambulatory Visit (HOSPITAL_COMMUNITY): Admit: 2017-05-24 | Payer: Medicare HMO | Admitting: Gastroenterology

## 2017-05-24 ENCOUNTER — Telehealth: Payer: Self-pay

## 2017-05-24 NOTE — Telephone Encounter (Signed)
See separate triage.  

## 2017-05-28 ENCOUNTER — Other Ambulatory Visit: Payer: Self-pay

## 2017-06-13 NOTE — Telephone Encounter (Signed)
Gastroenterology Pre-Procedure Review  Request Date: 05/24/2017 Requesting Physician:   PATIENT REVIEW QUESTIONS: The patient responded to the following health history questions as indicated:    1. Diabetes Melitis: no 2. Joint replacements in the past 12 months: no 3. Major health problems in the past 3 months: no 4. Has an artificial valve or MVP: no 5. Has a defibrillator: no 6. Has been advised in past to take antibiotics in advance of a procedure like teeth cleaning: no 7. Family history of colon cancer: no  8. Alcohol Use: YES     2 beers about once a week and a couple of shots once a week 9. History of sleep apnea: no  10. History of coronary artery or other vascular stents placed within the last 12 months: no 11. History of any prior anesthesia complications: no    MEDICATIONS & ALLERGIES:    Patient reports the following regarding taking any blood thinners:   Plavix? no Aspirin? no Coumadin? no Brilinta? no Xarelto? NO Eliquis? no Pradaxa? no Savaysa? no Effient? no  Patient confirms/reports the following medications:  Current Outpatient Medications  Medication Sig Dispense Refill  . albuterol (PROVENTIL HFA;VENTOLIN HFA) 108 (90 Base) MCG/ACT inhaler Inhale 1-2 puffs into the lungs every 6 (six) hours as needed for wheezing or shortness of breath. 1 Inhaler 0   No current facility-administered medications for this visit.     Patient confirms/reports the following allergies:  No Known Allergies  No orders of the defined types were placed in this encounter.   AUTHORIZATION INFORMATION Primary Insurance:   ID #:   Group #:  Pre-Cert / Auth required: Pre-Cert / Auth #:   Secondary Insurance:   ID #:   Group #:  Pre-Cert / Auth required:  Pre-Cert / Auth #:   SCHEDULE INFORMATION: Procedure has been scheduled as follows:  Date: 06/25/2017             Time:  9:30 AM Location: [facility] Short Stay  This Gastroenterology Pre-Precedure Review Form  is being routed to the following provider(s): Barney Drain, MD

## 2017-06-14 NOTE — Telephone Encounter (Signed)
Ok to schedule.

## 2017-06-21 ENCOUNTER — Other Ambulatory Visit: Payer: Self-pay

## 2017-06-21 DIAGNOSIS — Z1211 Encounter for screening for malignant neoplasm of colon: Secondary | ICD-10-CM

## 2017-06-21 MED ORDER — PEG 3350-KCL-NA BICARB-NACL 420 G PO SOLR: 4000.0000 mL | ORAL | 0 refills | Status: DC

## 2017-06-21 NOTE — Telephone Encounter (Signed)
  Glen at the pharmacy is aware of the written instructions for pt.  I have left another Vm at [phone] for pt to go to the pharmacy , he needs to start prep this weekend. The number [phone] is not working.

## 2017-06-21 NOTE — Telephone Encounter (Signed)
Rx sent to the pharmacy and instructions faxed to the pharmacy. I have called [phone] and left Vm for pt to call as soon as he get this message. His instructions will be at the pharmacy and he will have to get the Bisacodyl  5 mg tablets and one fleet enema. That was written on the fax to the pharmacy. He will need to start prep on Sat, so therefore should pick up prep and instructions by Friday.

## 2017-06-22 NOTE — Telephone Encounter (Signed)
I called pharmacy and was told pt has not picked up prep and instructions. They gave me another phone number of [phone] and I left Vm that he needs to pick up prep to start on Sat.  I asked him to call me back if he gets the message before 12:00 noon.

## 2017-06-25 ENCOUNTER — Telehealth: Payer: Self-pay | Admitting: Gastroenterology

## 2017-06-25 NOTE — Telephone Encounter (Signed)
PT is aware of new appt date of 07/13/2017 at 9:30 am with Dr. Oneida Alar and to pick up prep at [facility] I have updated his new address in chart.  Mailed the instructions to new address.

## 2017-06-25 NOTE — Telephone Encounter (Addendum)
I spoke to pt. He has had some ham and Ritz crackers this AM.

## 2017-06-25 NOTE — Telephone Encounter (Signed)
I tried to call pt back several times, left VM. I have rescheduled him for 07/13/2017 at 9:30 with Dr. Oneida Alar. His prep previously sent to the pharmacy and I am mailing new instructions for him.  Hoyle Sauer is aware in endo.

## 2017-06-25 NOTE — Telephone Encounter (Signed)
[phone] PATIENT RETURNED CALL, PLEASE CALL BACK

## 2017-06-25 NOTE — Telephone Encounter (Signed)
Pt didn't show for his procedure with SF this morning per Anderson Malta (Endo nurse). Pt didn't do his prep and asked if he could do it tomorrow. The Endo nurse said that the patient will be calling us or the triage nurse could call him to get him rescheduled.

## 2017-07-11 ENCOUNTER — Other Ambulatory Visit: Payer: Self-pay

## 2017-07-11 ENCOUNTER — Encounter (HOSPITAL_COMMUNITY): Payer: Self-pay | Admitting: Cardiology

## 2017-07-11 ENCOUNTER — Emergency Department (HOSPITAL_COMMUNITY): Payer: Medicare HMO

## 2017-07-11 ENCOUNTER — Emergency Department (HOSPITAL_COMMUNITY)
Admission: EM | Admit: 2017-07-11 | Discharge: 2017-07-11 | Disposition: A | Discharge status: Home / Self Care | Payer: Medicare HMO | Attending: Emergency Medicine | Admitting: Emergency Medicine

## 2017-07-11 DIAGNOSIS — I10 Essential (primary) hypertension: Secondary | ICD-10-CM | POA: Diagnosis not present

## 2017-07-11 DIAGNOSIS — E785 Hyperlipidemia, unspecified: Secondary | ICD-10-CM | POA: Insufficient documentation

## 2017-07-11 DIAGNOSIS — Z87891 Personal history of nicotine dependence: Secondary | ICD-10-CM | POA: Diagnosis not present

## 2017-07-11 DIAGNOSIS — J209 Acute bronchitis, unspecified: Secondary | ICD-10-CM | POA: Insufficient documentation

## 2017-07-11 DIAGNOSIS — R05 Cough: Secondary | ICD-10-CM | POA: Diagnosis not present

## 2017-07-11 DIAGNOSIS — J45909 Unspecified asthma, uncomplicated: Secondary | ICD-10-CM | POA: Diagnosis not present

## 2017-07-11 DIAGNOSIS — R0602 Shortness of breath: Secondary | ICD-10-CM | POA: Diagnosis not present

## 2017-07-11 LAB — COMPREHENSIVE METABOLIC PANEL
ALK PHOS: 54 U/L (ref 38–126)
ALT: 14 U/L — AB (ref 17–63)
AST: 27 U/L (ref 15–41)
Albumin: 3.5 g/dL (ref 3.5–5.0)
Anion gap: 9 (ref 5–15)
BILIRUBIN TOTAL: 0.6 mg/dL (ref 0.3–1.2)
BUN: 11 mg/dL (ref 6–20)
CALCIUM: 8.6 mg/dL — AB (ref 8.9–10.3)
CHLORIDE: 104 mmol/L (ref 101–111)
CO2: 25 mmol/L (ref 22–32)
CREATININE: 1.23 mg/dL (ref 0.61–1.24)
GFR calc Af Amer: >60 mL/min (ref >60)
GFR, EST NON AFRICAN AMERICAN: 60 mL/min — AB (ref >60)
Glucose, Bld: 103 mg/dL — ABNORMAL HIGH (ref 65–99)
Potassium: 3.9 mmol/L (ref 3.5–5.1)
Sodium: 138 mmol/L (ref 135–145)
TOTAL PROTEIN: 6.5 g/dL (ref 6.5–8.1)

## 2017-07-11 LAB — TROPONIN I: Troponin I: <0.03 ng/mL (ref <0.03)

## 2017-07-11 LAB — LIPASE, BLOOD: Lipase: 29 U/L (ref 11–51)

## 2017-07-11 LAB — RAPID STREP SCREEN (MED CTR MEBANE ONLY): Streptococcus, Group A Screen (Direct): NEGATIVE

## 2017-07-11 MED ORDER — PREDNISONE 50 MG PO TABS: ORAL_TABLET | ORAL | 0 refills | Status: DC

## 2017-07-11 MED ORDER — ALBUTEROL SULFATE HFA 108 (90 BASE) MCG/ACT IN AERS: 1.0000 | INHALATION_SPRAY | Freq: Four times a day (QID) | RESPIRATORY_TRACT | 0 refills | Status: DC | PRN

## 2017-07-11 MED ORDER — ALBUTEROL SULFATE (2.5 MG/3ML) 0.083% IN NEBU
5.0000 mg | INHALATION_SOLUTION | Freq: Once | RESPIRATORY_TRACT | Status: AC
  Administered 2017-07-11: 5 mg via RESPIRATORY_TRACT
  Filled 2017-07-11: qty 6

## 2017-07-11 MED ORDER — PREDNISONE 50 MG PO TABS
60.0000 mg | ORAL_TABLET | Freq: Once | ORAL | Status: AC
Start: 2017-07-11 — End: 2017-07-11
  Administered 2017-07-11: 60 mg via ORAL
  Filled 2017-07-11: qty 1

## 2017-07-11 NOTE — ED Triage Notes (Signed)
C/o chest  Congestion, cough and tightness since Monday

## 2017-07-11 NOTE — ED Provider Notes (Addendum)
Complains of cough with some slight amount of productive greenish sputum onset 2 days ago.  He has been treated himself with over-the-counter cold medicine as well as with his albuterol inhaler with partial relief.  Presently his chest feels slightly "tight" similar asthma.  On exam patient is alert speaks in paragraphs.  No respiratory distress.  Lungs with diffuse scant rhonchi. Chest x-ray viewed by me   Orlie Dakin, MD 07/11/17 1114    Orlie Dakin, MD 07/11/17 1558

## 2017-07-11 NOTE — ED Provider Notes (Signed)
[facility] EMERGENCY DEPARTMENT Provider Note   CSN: [id number] Arrival date & time: 07/11/17  8850     History   Chief Complaint Chief Complaint  Patient presents with  . Cough    HPI Charles Rivers is a 67 y.o. male with a past medical history of asthma, hyperlipidemia, GERD and chronic back pain secondary to degenerative disc disease presenting with a 2-day history of URI type symptoms with intermittent episodes of wheezing and chest tightness.  He reports having a cough which has been productive of clear to yellow sputum along with nasal congestion with similar drainage, episodes of wheezing with cough and chest tightness symptoms currently resolved, patient last used his albuterol MDI yesterday evening with improvement in this symptom.  He is also used Alka-Seltzer cold formula and NyQuil which has offered transient relief of symptoms.  He has also had sore throat and vague nausea with soreness in his epigastric area worsened with movement, currently resolved.  Denies  diarrhea, dysuria.  He has had no fevers or chills, headache or neck pain.   The history is provided by the patient.    Past Medical History:  Diagnosis Date  . Asthma   . Chronic back pain   . Hyperlipidemia     Patient Active Problem List   Diagnosis Date Noted  . GERD (gastroesophageal reflux disease) 10/31/2013  . Nausea 10/31/2013  . Nail fungus 10/01/2013  . Hyperlipidemia 01/08/2013  . Bulging lumbar disc 01/08/2013  . Positive fecal occult blood test 01/08/2013  . Marijuana abuse 06/21/2012  . Lipoma 08/22/2011  . Dyspepsia 04/30/2011  . Asthma 02/15/2011  . Back pain with radiation 02/15/2011  . Erectile dysfunction 02/15/2011    Past Surgical History:  Procedure Laterality Date  . Right middle finger reconstruction         Home Medications    Prior to Admission medications   Medication Sig Start Date End Date Taking? Authorizing Provider  albuterol (PROVENTIL HFA;VENTOLIN HFA) 108  (90 Base) MCG/ACT inhaler Inhale 1-2 puffs into the lungs every 6 (six) hours as needed for wheezing or shortness of breath. 07/11/17   Evalee Jefferson, PA-C  bismuth subsalicylate (PEPTO BISMOL) 262 MG/15ML suspension Take 30 mLs by mouth daily as needed for indigestion.    [provider]  ibuprofen (ADVIL,MOTRIN) 200 MG tablet Take 400 mg by mouth daily as needed for headache or moderate pain.    [provider]  polyethylene glycol-electrolytes (TRILYTE) 420 g solution Take 4,000 mLs by mouth as directed. 06/21/17   Mahala Menghini, PA-C  predniSONE (DELTASONE) 50 MG tablet Take one tablet daily for 4 days, starting on 07/12/17 07/11/17   Evalee Jefferson, PA-C    Family History Family History  Problem Relation Age of Onset  . Diabetes Mother   . Cancer Mother        breast  . Cancer Father   . Asthma Brother     Social History Social History   Tobacco Use  . Smoking status: Former Smoker    Last attempt to quit: 07/27/1967    Years since quitting: 49.9  . Smokeless tobacco: Never Used  Substance Use Topics  . Alcohol use: Yes    Comment: 2-3 beers a week  . Drug use: Yes    Types: Marijuana    Comment: marajuana many years ago     Allergies   Patient has no known allergies.   Review of Systems Review of Systems  Constitutional: Negative for fever.  HENT: Positive for congestion. Negative for sore throat.   Eyes: Negative.   Respiratory: Positive for cough, chest tightness, shortness of breath and wheezing.   Cardiovascular: Negative for chest pain.  Gastrointestinal: Positive for abdominal pain and nausea. Negative for diarrhea and vomiting.  Genitourinary: Negative.   Musculoskeletal: Negative for arthralgias, joint swelling and neck pain.  Skin: Negative.  Negative for rash and wound.  Neurological: Negative for dizziness, weakness, light-headedness, numbness and headaches.  Psychiatric/Behavioral: Negative.      Physical Exam Updated Vital Signs BP  (!) 159/102   Pulse 68   Temp 98.5 F (36.9 C) (Oral)   Resp 20   Ht 6' (1.829 m)   Wt 65.8 kg (145 lb)   SpO2 97%   BMI 19.67 kg/m   Physical Exam  Constitutional: He is oriented to person, place, and time. He appears well-developed and well-nourished.  HENT:  Head: Normocephalic and atraumatic.  Right Ear: Tympanic membrane and ear canal normal.  Left Ear: Tympanic membrane and ear canal normal.  Nose: No mucosal edema or rhinorrhea.  Mouth/Throat: Uvula is midline and mucous membranes are normal. Posterior oropharyngeal erythema present. No oropharyngeal exudate, posterior oropharyngeal edema or tonsillar abscesses.  There is some erythema most prominent along the left soft palate.  Eyes: Conjunctivae are normal.  Cardiovascular: Normal rate and normal heart sounds.  Pulmonary/Chest: Effort normal. No stridor. No respiratory distress. He has no decreased breath sounds. He has no wheezes. He has no rhonchi. He has no rales.  Abdominal: Soft. There is tenderness in the epigastric area. There is no rigidity, no rebound and no guarding.  Musculoskeletal: Normal range of motion.  Neurological: He is alert and oriented to person, place, and time.  Skin: Skin is warm and dry. No rash noted.  Psychiatric: He has a normal mood and affect.     ED Treatments / Results  Labs (all labs ordered are listed, but only abnormal results are displayed) Labs Reviewed  COMPREHENSIVE METABOLIC PANEL - Abnormal; Notable for the following components:      Result Value   Glucose, Bld 103 (*)    Calcium 8.6 (*)    ALT 14 (*)    GFR calc non Af Amer 60 (*)    All other components within normal limits  RAPID STREP SCREEN (NOT AT [facility])  CULTURE, GROUP A STREP [facility])  LIPASE, BLOOD  TROPONIN I    EKG  EKG Interpretation  Date/Time:  Wednesday July 11 2017 10:07:36 EST Ventricular Rate:  67 PR Interval:    QRS Duration: 90 QT Interval:  396 QTC Calculation: 418 R Axis:   82 Text  Interpretation:  Sinus rhythm Borderline right axis deviation No significant change since last tracing Confirmed by Orlie Dakin [phone]) on 07/11/2017 10:13:33 AM       Radiology Dg Chest 2 View  Result Date: 07/11/2017 CLINICAL DATA:  Shortness of breath. Cough. Chest tightness and wheezing. EXAM: CHEST  2 VIEW COMPARISON:  07/05/2011 FINDINGS: The heart size and mediastinal contours are within normal limits. Both lungs are clear except for peribronchial thickening. No effusions. The visualized skeletal structures are unremarkable. IMPRESSION: Slight bronchitic changes. Electronically Signed   By: Lorriane Shire M.D.   On: 07/11/2017 10:29    Procedures Procedures (including critical care time)  Medications Ordered in ED Medications  predniSONE (DELTASONE) tablet 60 mg (60 mg Oral Given 07/11/17 1048)  albuterol (PROVENTIL) (2.5 MG/3ML) 0.083% nebulizer solution 5 mg (5 mg Nebulization Given 07/11/17 1119)  Initial Impression / Assessment and Plan / ED Course  I have reviewed the triage vital signs and the nursing notes.  Pertinent labs & imaging results that were available during my care of the patient were reviewed by me and considered in my medical decision making (see chart for details).     Patient was given an albuterol nebulizer treatment today, still no wheezing after this treatment but reported chest tightness was improved.  Discussed elevated blood pressure and need for recheck of this in 1 week by his PCP.  Also discussed cessation of marijuana use as this can impact his asthma even more than smoking cigarettes can.  He was given a pulse dose of prednisone.  Plan follow-up with his PCP, strict return precautions discussed.  Final Clinical Impressions(s) / ED Diagnoses   Final diagnoses:  Acute bronchitis, unspecified organism  Essential hypertension    ED Discharge Orders        Ordered    predniSONE (DELTASONE) 50 MG tablet     07/11/17 1141    albuterol  (PROVENTIL HFA;VENTOLIN HFA) 108 (90 Base) MCG/ACT inhaler  Every 6 hours PRN     07/11/17 1141       Evalee Jefferson, PA-C 07/11/17 1145    Orlie Dakin, MD 07/11/17 1557

## 2017-07-11 NOTE — Discharge Instructions (Signed)
Take your next dose of prednisone tomorrow morning.  Use your inhaler as prescribed.  Your labs, chest x-ray and EKG today are all normal and/or stable. You should consider cessation of marijuana use as this can impact your asthma, even more than cigarette smoking. Your blood pressure is elevated today, you should have this rechecked within 1 week.

## 2017-07-11 NOTE — ED Notes (Signed)
Patient to xray.

## 2017-07-11 NOTE — ED Notes (Signed)
ED Provider at bedside. 

## 2017-07-13 ENCOUNTER — Telehealth: Payer: Self-pay

## 2017-07-13 ENCOUNTER — Encounter: Payer: Self-pay | Admitting: General Practice

## 2017-07-13 ENCOUNTER — Encounter (HOSPITAL_COMMUNITY): Admission: RE | Payer: Self-pay | Source: Ambulatory Visit

## 2017-07-13 ENCOUNTER — Ambulatory Visit (HOSPITAL_COMMUNITY): Admission: RE | Admit: 2017-07-13 | Payer: Medicare HMO | Source: Ambulatory Visit | Admitting: Gastroenterology

## 2017-07-13 SURGERY — COLONOSCOPY
Anesthesia: Moderate Sedation

## 2017-07-13 NOTE — Telephone Encounter (Signed)
PT IS A NO SHOW x2 FOR COLONOSCOPY. HE CANNOT RESCHEDULE WITH [facility]. PLEASE NOTE IN THE SYSTEM.

## 2017-07-13 NOTE — Telephone Encounter (Signed)
Melanie at [facility] called office. Pt was no show for his colonoscopy this morning. She was unable to reach him by phone.  Routing to DS as FYI.

## 2017-07-13 NOTE — Telephone Encounter (Signed)
I will mail a discharge letter

## 2017-07-14 LAB — CULTURE, GROUP A STREP (THRC)

## 2017-08-22 NOTE — Telephone Encounter (Signed)
Discharge letter was returned- unclaimed. I checked pts address in chart and it is different. I have mailed the letter to the new address in epic via [place].

## 2017-09-17 DIAGNOSIS — K219 Gastro-esophageal reflux disease without esophagitis: Secondary | ICD-10-CM | POA: Diagnosis not present

## 2017-09-17 DIAGNOSIS — F17209 Nicotine dependence, unspecified, with unspecified nicotine-induced disorders: Secondary | ICD-10-CM | POA: Diagnosis not present

## 2017-09-17 DIAGNOSIS — J4530 Mild persistent asthma, uncomplicated: Secondary | ICD-10-CM | POA: Diagnosis not present

## 2017-09-17 DIAGNOSIS — F17210 Nicotine dependence, cigarettes, uncomplicated: Secondary | ICD-10-CM | POA: Diagnosis not present

## 2017-10-11 DIAGNOSIS — K219 Gastro-esophageal reflux disease without esophagitis: Secondary | ICD-10-CM | POA: Diagnosis not present

## 2017-10-11 DIAGNOSIS — M549 Dorsalgia, unspecified: Secondary | ICD-10-CM | POA: Diagnosis not present

## 2017-10-11 DIAGNOSIS — R21 Rash and other nonspecific skin eruption: Secondary | ICD-10-CM | POA: Diagnosis not present

## 2017-10-11 DIAGNOSIS — M67823 Other specified disorders of tendon, right elbow: Secondary | ICD-10-CM | POA: Diagnosis not present

## 2017-11-16 DIAGNOSIS — T23201A Burn of second degree of right hand, unspecified site, initial encounter: Secondary | ICD-10-CM | POA: Diagnosis not present

## 2017-11-16 DIAGNOSIS — M549 Dorsalgia, unspecified: Secondary | ICD-10-CM | POA: Diagnosis not present

## 2017-11-16 DIAGNOSIS — J4530 Mild persistent asthma, uncomplicated: Secondary | ICD-10-CM | POA: Diagnosis not present

## 2017-12-24 DIAGNOSIS — K219 Gastro-esophageal reflux disease without esophagitis: Secondary | ICD-10-CM | POA: Diagnosis not present

## 2017-12-24 DIAGNOSIS — J4530 Mild persistent asthma, uncomplicated: Secondary | ICD-10-CM | POA: Diagnosis not present

## 2017-12-24 DIAGNOSIS — M549 Dorsalgia, unspecified: Secondary | ICD-10-CM | POA: Diagnosis not present

## 2017-12-24 DIAGNOSIS — F17210 Nicotine dependence, cigarettes, uncomplicated: Secondary | ICD-10-CM | POA: Diagnosis not present

## 2017-12-24 DIAGNOSIS — F17209 Nicotine dependence, unspecified, with unspecified nicotine-induced disorders: Secondary | ICD-10-CM | POA: Diagnosis not present

## 2018-01-23 ENCOUNTER — Other Ambulatory Visit (HOSPITAL_COMMUNITY): Payer: Self-pay | Admitting: Internal Medicine

## 2018-01-23 ENCOUNTER — Ambulatory Visit (HOSPITAL_COMMUNITY)
Admission: RE | Admit: 2018-01-23 | Discharge: 2018-01-23 | Disposition: A | Discharge status: Home / Self Care | Payer: Medicare HMO | Source: Ambulatory Visit | Attending: Internal Medicine | Admitting: Internal Medicine

## 2018-01-23 DIAGNOSIS — R531 Weakness: Secondary | ICD-10-CM | POA: Diagnosis not present

## 2018-01-23 DIAGNOSIS — R0989 Other specified symptoms and signs involving the circulatory and respiratory systems: Secondary | ICD-10-CM | POA: Insufficient documentation

## 2018-01-23 DIAGNOSIS — R059 Cough, unspecified: Secondary | ICD-10-CM

## 2018-01-23 DIAGNOSIS — R05 Cough: Secondary | ICD-10-CM | POA: Diagnosis not present

## 2018-01-23 DIAGNOSIS — E860 Dehydration: Secondary | ICD-10-CM | POA: Diagnosis not present

## 2018-01-23 DIAGNOSIS — J4530 Mild persistent asthma, uncomplicated: Secondary | ICD-10-CM | POA: Diagnosis not present

## 2018-01-25 DIAGNOSIS — E860 Dehydration: Secondary | ICD-10-CM | POA: Diagnosis not present

## 2018-01-25 DIAGNOSIS — R531 Weakness: Secondary | ICD-10-CM | POA: Diagnosis not present

## 2018-02-17 ENCOUNTER — Encounter (HOSPITAL_COMMUNITY): Payer: Self-pay

## 2018-02-17 ENCOUNTER — Emergency Department (HOSPITAL_COMMUNITY)
Admission: EM | Admit: 2018-02-17 | Discharge: 2018-02-17 | Disposition: A | Discharge status: Home / Self Care | Payer: Medicare HMO | Attending: Emergency Medicine | Admitting: Emergency Medicine

## 2018-02-17 DIAGNOSIS — Y939 Activity, unspecified: Secondary | ICD-10-CM | POA: Insufficient documentation

## 2018-02-17 DIAGNOSIS — J45909 Unspecified asthma, uncomplicated: Secondary | ICD-10-CM | POA: Diagnosis not present

## 2018-02-17 DIAGNOSIS — X58XXXA Exposure to other specified factors, initial encounter: Secondary | ICD-10-CM | POA: Insufficient documentation

## 2018-02-17 DIAGNOSIS — Y999 Unspecified external cause status: Secondary | ICD-10-CM | POA: Insufficient documentation

## 2018-02-17 DIAGNOSIS — Z87891 Personal history of nicotine dependence: Secondary | ICD-10-CM | POA: Diagnosis not present

## 2018-02-17 DIAGNOSIS — Z79899 Other long term (current) drug therapy: Secondary | ICD-10-CM | POA: Insufficient documentation

## 2018-02-17 DIAGNOSIS — S76012A Strain of muscle, fascia and tendon of left hip, initial encounter: Secondary | ICD-10-CM | POA: Insufficient documentation

## 2018-02-17 DIAGNOSIS — S76312A Strain of muscle, fascia and tendon of the posterior muscle group at thigh level, left thigh, initial encounter: Secondary | ICD-10-CM | POA: Diagnosis not present

## 2018-02-17 DIAGNOSIS — Y929 Unspecified place or not applicable: Secondary | ICD-10-CM | POA: Insufficient documentation

## 2018-02-17 DIAGNOSIS — S79912A Unspecified injury of left hip, initial encounter: Secondary | ICD-10-CM | POA: Diagnosis present

## 2018-02-17 DIAGNOSIS — T148XXA Other injury of unspecified body region, initial encounter: Secondary | ICD-10-CM

## 2018-02-17 MED ORDER — CYCLOBENZAPRINE HCL 10 MG PO TABS: 10.0000 mg | ORAL_TABLET | Freq: Three times a day (TID) | ORAL | 0 refills | Status: DC

## 2018-02-17 MED ORDER — DICLOFENAC SODIUM 75 MG PO TBEC: 75.0000 mg | DELAYED_RELEASE_TABLET | Freq: Two times a day (BID) | ORAL | 0 refills | Status: DC

## 2018-02-17 NOTE — ED Provider Notes (Signed)
[facility] EMERGENCY DEPARTMENT Provider Note   CSN: [id number] Arrival date & time: 02/17/18  1741     History   Chief Complaint Chief Complaint  Patient presents with  . Leg Pain    HPI Charles Rivers is a 67 y.o. male.  Patient is a 67 year old male who presents to the emergency department with thigh and hip area pain.  The patient states that he has a history of degenerative disc disease at multiple areas of his lumbar spine.  He states that earlier during this week he was deep sea fishing.  On Tuesday he developed pain and cramping in his left thigh and hip area.  He says the pain is worse with certain movements.  He has not had any loss of bowel or bladder function.  He has not had any decrease in sensation or numbness in the saddle area.  He has had not had any new back pain.  He has tried warm Epson salt soaks with only minimal improvement.  He presents now for assistance with this issue  The history is provided by the patient.  Leg Pain      Past Medical History:  Diagnosis Date  . Asthma   . Chronic back pain   . Hyperlipidemia     Patient Active Problem List   Diagnosis Date Noted  . GERD (gastroesophageal reflux disease) 10/31/2013  . Nausea 10/31/2013  . Nail fungus 10/01/2013  . Hyperlipidemia 01/08/2013  . Bulging lumbar disc 01/08/2013  . Positive fecal occult blood test 01/08/2013  . Marijuana abuse 06/21/2012  . Lipoma 08/22/2011  . Dyspepsia 04/30/2011  . Asthma 02/15/2011  . Back pain with radiation 02/15/2011  . Erectile dysfunction 02/15/2011    Past Surgical History:  Procedure Laterality Date  . Right middle finger reconstruction          Home Medications    Prior to Admission medications   Medication Sig Start Date End Date Taking? Authorizing Provider  albuterol (PROVENTIL HFA;VENTOLIN HFA) 108 (90 Base) MCG/ACT inhaler Inhale 1-2 puffs into the lungs every 6 (six) hours as needed for wheezing or shortness of breath. 07/11/17    Evalee Jefferson, PA-C  bismuth subsalicylate (PEPTO BISMOL) 262 MG/15ML suspension Take 30 mLs by mouth daily as needed for indigestion.    [provider]  ibuprofen (ADVIL,MOTRIN) 200 MG tablet Take 400 mg by mouth daily as needed for headache or moderate pain.    [provider]  polyethylene glycol-electrolytes (TRILYTE) 420 g solution Take 4,000 mLs by mouth as directed. 06/21/17   Mahala Menghini, PA-C  predniSONE (DELTASONE) 50 MG tablet Take one tablet daily for 4 days, starting on 07/12/17 07/11/17   Evalee Jefferson, PA-C    Family History Family History  Problem Relation Age of Onset  . Diabetes Mother   . Cancer Mother        breast  . Cancer Father   . Asthma Brother     Social History Social History   Tobacco Use  . Smoking status: Former Smoker    Last attempt to quit: 07/27/1967    Years since quitting: 50.5  . Smokeless tobacco: Never Used  Substance Use Topics  . Alcohol use: Yes    Comment: 2-3 beers a week  . Drug use: Yes    Types: Marijuana    Comment: marajuana many years ago     Allergies   Patient has no known allergies.   Review of Systems Review of Systems  Constitutional:  Negative for activity change.       All ROS Neg except as noted in HPI  HENT: Negative for nosebleeds.   Eyes: Negative for photophobia and discharge.  Respiratory: Negative for cough, shortness of breath and wheezing.   Cardiovascular: Negative for chest pain and palpitations.  Gastrointestinal: Negative for abdominal pain and blood in stool.  Genitourinary: Negative for dysuria, frequency and hematuria.  Musculoskeletal: Positive for back pain. Negative for arthralgias and neck pain.       Thigh and hip area pain  Skin: Negative.   Neurological: Negative for dizziness, seizures and speech difficulty.  Psychiatric/Behavioral: Negative for confusion and hallucinations.     Physical Exam Updated Vital Signs BP 107/67 (BP Location: Right Arm)   Pulse 77   Temp  98.5 F (36.9 C) (Oral)   Resp 18   Wt 65.8 kg   SpO2 97%   BMI 19.67 kg/m   Physical Exam  Musculoskeletal:       Left upper leg: He exhibits tenderness.       Legs: I can reproduce the pain and cramping with adduction and abduction of the left hip.  No deformity appreciated.  No hot joint noted of the hip, knee, or ankle on the left.     ED Treatments / Results  Labs (all labs ordered are listed, but only abnormal results are displayed) Labs Reviewed - No data to display  EKG None  Radiology No results found.  Procedures Procedures (including critical care time)  Medications Ordered in ED Medications - No data to display   Initial Impression / Assessment and Plan / ED Course  I have reviewed the triage vital signs and the nursing notes.  Pertinent labs & imaging results that were available during my care of the patient were reviewed by me and considered in my medical decision making (see chart for details).       Final Clinical Impressions(s) / ED Diagnoses MDM  Vital signs are within normal limits.  Pulse oximetry is 97% on room air.  The patient states he has been deep sea fishing earlier during the week.  He states he had a pretty large catch.  Since that time is been having pain on the left hip and thigh area.  The neurologic examination is negative for acute deficit.  No evidence for cauda equina.  I suspect that the patient has a muscle strain involving this area.  The patient will continue his Epson salt soaks.  He will use diclofenac 2 times daily with a meal, and he is given a prescription for Flexeril 3 times daily as needed for spasm pain.  Patient is to follow-up with Dr. Legrand Rams or return to the emergency department if any changes in his condition, problems, or concerns.   Final diagnoses:  Muscle strain    ED Discharge Orders         Ordered    cyclobenzaprine (FLEXERIL) 10 MG tablet  3 times daily     02/17/18 1839    diclofenac (VOLTAREN) 75 MG  EC tablet  2 times daily     02/17/18 1840           Lily Kocher, PA-C 02/17/18 1852    Nat Christen, MD 02/20/18 [phone]

## 2018-02-17 NOTE — Discharge Instructions (Signed)
Your examination suggest muscle strain involving your thigh and hip area.  Your neurologic examination is within normal limits.  Please continue the Epson salt soaks.  Please use diclofenac 2 times daily with a meal.  Use Flexeril 3 times daily as needed for spasm pain. This medication may cause drowsiness. Please do not drink, drive, or participate in activity that requires concentration while taking this medication.

## 2018-02-17 NOTE — ED Notes (Signed)
Pt reports a history of disc disease  Reports pain to his L upper outer thigh  Was followed by a pain doctor in [place], but when he saw a Psychologist, sport and exercise, they discharged him.  He reports taking OTC meds without relief  Taking mainly ibuprofen without relief

## 2018-02-17 NOTE — ED Triage Notes (Signed)
Pt reports he went deep sea fishing on Monday and since he has been back left upper leg has been cramping. Pain since tuesday

## 2018-02-21 ENCOUNTER — Other Ambulatory Visit (HOSPITAL_COMMUNITY): Payer: Self-pay | Admitting: Internal Medicine

## 2018-02-21 ENCOUNTER — Ambulatory Visit (HOSPITAL_COMMUNITY)
Admission: RE | Admit: 2018-02-21 | Discharge: 2018-02-21 | Disposition: A | Discharge status: Home / Self Care | Payer: Medicare HMO | Source: Ambulatory Visit | Attending: Internal Medicine | Admitting: Internal Medicine

## 2018-02-21 DIAGNOSIS — M47816 Spondylosis without myelopathy or radiculopathy, lumbar region: Secondary | ICD-10-CM | POA: Diagnosis not present

## 2018-02-21 DIAGNOSIS — M25552 Pain in left hip: Secondary | ICD-10-CM

## 2018-02-21 DIAGNOSIS — M79605 Pain in left leg: Secondary | ICD-10-CM | POA: Diagnosis not present

## 2018-02-21 DIAGNOSIS — M1612 Unilateral primary osteoarthritis, left hip: Secondary | ICD-10-CM | POA: Diagnosis not present

## 2018-03-13 DIAGNOSIS — R531 Weakness: Secondary | ICD-10-CM | POA: Diagnosis not present

## 2018-03-13 DIAGNOSIS — Z1331 Encounter for screening for depression: Secondary | ICD-10-CM | POA: Diagnosis not present

## 2018-03-13 DIAGNOSIS — Z0001 Encounter for general adult medical examination with abnormal findings: Secondary | ICD-10-CM | POA: Diagnosis not present

## 2018-03-13 DIAGNOSIS — M549 Dorsalgia, unspecified: Secondary | ICD-10-CM | POA: Diagnosis not present

## 2018-03-13 DIAGNOSIS — Z23 Encounter for immunization: Secondary | ICD-10-CM | POA: Diagnosis not present

## 2018-03-13 DIAGNOSIS — R7989 Other specified abnormal findings of blood chemistry: Secondary | ICD-10-CM | POA: Diagnosis not present

## 2018-03-13 DIAGNOSIS — Z1389 Encounter for screening for other disorder: Secondary | ICD-10-CM | POA: Diagnosis not present

## 2018-03-13 DIAGNOSIS — Z0000 Encounter for general adult medical examination without abnormal findings: Secondary | ICD-10-CM | POA: Diagnosis not present

## 2018-03-13 DIAGNOSIS — E860 Dehydration: Secondary | ICD-10-CM | POA: Diagnosis not present

## 2018-03-13 DIAGNOSIS — J4530 Mild persistent asthma, uncomplicated: Secondary | ICD-10-CM | POA: Diagnosis not present

## 2018-04-07 ENCOUNTER — Other Ambulatory Visit: Payer: Self-pay

## 2018-04-07 ENCOUNTER — Emergency Department (HOSPITAL_COMMUNITY)
Admission: EM | Admit: 2018-04-07 | Discharge: 2018-04-07 | Disposition: A | Discharge status: Home / Self Care | Payer: Medicare HMO | Attending: Emergency Medicine | Admitting: Emergency Medicine

## 2018-04-07 ENCOUNTER — Encounter (HOSPITAL_COMMUNITY): Payer: Self-pay | Admitting: Emergency Medicine

## 2018-04-07 DIAGNOSIS — T180XXA Foreign body in mouth, initial encounter: Secondary | ICD-10-CM | POA: Diagnosis not present

## 2018-04-07 DIAGNOSIS — F1210 Cannabis abuse, uncomplicated: Secondary | ICD-10-CM | POA: Insufficient documentation

## 2018-04-07 DIAGNOSIS — S00502A Unspecified superficial injury of oral cavity, initial encounter: Secondary | ICD-10-CM | POA: Diagnosis not present

## 2018-04-07 DIAGNOSIS — Y9389 Activity, other specified: Secondary | ICD-10-CM | POA: Diagnosis not present

## 2018-04-07 DIAGNOSIS — J45909 Unspecified asthma, uncomplicated: Secondary | ICD-10-CM | POA: Insufficient documentation

## 2018-04-07 DIAGNOSIS — Z79899 Other long term (current) drug therapy: Secondary | ICD-10-CM | POA: Diagnosis not present

## 2018-04-07 DIAGNOSIS — X58XXXA Exposure to other specified factors, initial encounter: Secondary | ICD-10-CM | POA: Insufficient documentation

## 2018-04-07 DIAGNOSIS — Y929 Unspecified place or not applicable: Secondary | ICD-10-CM | POA: Insufficient documentation

## 2018-04-07 DIAGNOSIS — S0993XA Unspecified injury of face, initial encounter: Secondary | ICD-10-CM | POA: Diagnosis not present

## 2018-04-07 DIAGNOSIS — Z87891 Personal history of nicotine dependence: Secondary | ICD-10-CM | POA: Insufficient documentation

## 2018-04-07 DIAGNOSIS — Y998 Other external cause status: Secondary | ICD-10-CM | POA: Diagnosis not present

## 2018-04-07 NOTE — Discharge Instructions (Addendum)
Return if needed

## 2018-04-07 NOTE — ED Provider Notes (Signed)
[facility] EMERGENCY DEPARTMENT Provider Note   CSN: [id number] Arrival date & time: 04/07/18  1758     History   Chief Complaint Chief Complaint  Patient presents with  . Foreign Body in Skin    HPI Charles Rivers is a 67 y.o. male.  HPI   Charles Rivers is a 67 y.o. male who presents to the Emergency Department complaining of having a fishbone wedged between his right upper tooth and gums.  Reports eating fish earlier.  Has tried eating bread and using mouthwash w/o relief.  Complains of pain surrounding the tooth.  Denies bleeding of his gums or pain with swallowing.   Past Medical History:  Diagnosis Date  . Asthma   . Chronic back pain   . Hyperlipidemia     Patient Active Problem List   Diagnosis Date Noted  . GERD (gastroesophageal reflux disease) 10/31/2013  . Nausea 10/31/2013  . Nail fungus 10/01/2013  . Hyperlipidemia 01/08/2013  . Bulging lumbar disc 01/08/2013  . Positive fecal occult blood test 01/08/2013  . Marijuana abuse 06/21/2012  . Lipoma 08/22/2011  . Dyspepsia 04/30/2011  . Asthma 02/15/2011  . Back pain with radiation 02/15/2011  . Erectile dysfunction 02/15/2011    Past Surgical History:  Procedure Laterality Date  . Right middle finger reconstruction          Home Medications    Prior to Admission medications   Medication Sig Start Date End Date Taking? Authorizing Provider  albuterol (PROVENTIL HFA;VENTOLIN HFA) 108 (90 Base) MCG/ACT inhaler Inhale 1-2 puffs into the lungs every 6 (six) hours as needed for wheezing or shortness of breath. 07/11/17   Evalee Jefferson, PA-C  bismuth subsalicylate (PEPTO BISMOL) 262 MG/15ML suspension Take 30 mLs by mouth daily as needed for indigestion.    [provider]  cyclobenzaprine (FLEXERIL) 10 MG tablet Take 1 tablet (10 mg total) by mouth 3 (three) times daily. 02/17/18   Lily Kocher, PA-C  diclofenac (VOLTAREN) 75 MG EC tablet Take 1 tablet (75 mg total) by mouth 2 (two) times  daily. 02/17/18   Lily Kocher, PA-C  ibuprofen (ADVIL,MOTRIN) 200 MG tablet Take 400 mg by mouth daily as needed for headache or moderate pain.    [provider]  polyethylene glycol-electrolytes (TRILYTE) 420 g solution Take 4,000 mLs by mouth as directed. 06/21/17   Mahala Menghini, PA-C  predniSONE (DELTASONE) 50 MG tablet Take one tablet daily for 4 days, starting on 07/12/17 07/11/17   Evalee Jefferson, PA-C    Family History Family History  Problem Relation Age of Onset  . Diabetes Mother   . Cancer Mother        breast  . Cancer Father   . Asthma Brother     Social History Social History   Tobacco Use  . Smoking status: Former Smoker    Last attempt to quit: 07/27/1967    Years since quitting: 50.7  . Smokeless tobacco: Never Used  Substance Use Topics  . Alcohol use: Yes    Comment: 2-3 beers a week  . Drug use: Yes    Types: Marijuana    Comment: marajuana many years ago     Allergies   Patient has no known allergies.   Review of Systems Review of Systems  Constitutional: Negative for appetite change and fever.  HENT: Positive for dental problem. Negative for congestion, facial swelling, sore throat and trouble swallowing.   Musculoskeletal: Negative for neck pain and neck stiffness.  Physical Exam Updated Vital Signs BP 112/68 (BP Location: Right Arm)   Pulse 69   Temp 98.4 F (36.9 C) (Oral)   Resp 18   SpO2 98%   Physical Exam  Constitutional: He appears well-developed and well-nourished. No distress.  HENT:  Head: Normocephalic and atraumatic.  Right Ear: Tympanic membrane and ear canal normal.  Left Ear: Tympanic membrane and ear canal normal.  Mouth/Throat: Uvula is midline, oropharynx is clear and moist and mucous membranes are normal. No trismus in the jaw. Dental caries present. No dental abscesses or uvula swelling.  Multiple dental caries and missing teeth.  Fishbone visualized wedged between right upper second molar and  surrounding gum.  No bleeding or edema.  Pt handles secretions well.    Neck: Normal range of motion. Neck supple.  Cardiovascular: Normal rate and regular rhythm.  No murmur heard. Pulmonary/Chest: Effort normal and breath sounds normal.  Musculoskeletal: Normal range of motion.  Lymphadenopathy:    He has no cervical adenopathy.  Neurological: He is alert.  Skin: Skin is warm.  Nursing note and vitals reviewed.    ED Treatments / Results  Labs (all labs ordered are listed, but only abnormal results are displayed) Labs Reviewed - No data to display  EKG None  Radiology No results found.  Procedures Procedures (including critical care time)  Medications Ordered in ED Medications - No data to display   Initial Impression / Assessment and Plan / ED Course  I have reviewed the triage vital signs and the nursing notes.  Pertinent labs & imaging results that were available during my care of the patient were reviewed by me and considered in my medical decision making (see chart for details).     Visualized fishbone wedged between tooth and gum, easily removed by me with my gloved fingernail.  Pt tolerated well.  Denies pain or FB sensation of the throat.  Final Clinical Impressions(s) / ED Diagnoses   Final diagnoses:  Dental injury, initial encounter    ED Discharge Orders    None       Bufford Lope 04/08/18 1734    Virgel Manifold, MD 04/08/18 2323

## 2018-04-07 NOTE — ED Triage Notes (Signed)
Patient c/o fishbone stuck in right upper tooth. Per patient happened approx 1 hour ago.

## 2018-05-30 DIAGNOSIS — I10 Essential (primary) hypertension: Secondary | ICD-10-CM | POA: Diagnosis not present

## 2018-05-30 DIAGNOSIS — F17209 Nicotine dependence, unspecified, with unspecified nicotine-induced disorders: Secondary | ICD-10-CM | POA: Diagnosis not present

## 2018-05-30 DIAGNOSIS — Z23 Encounter for immunization: Secondary | ICD-10-CM | POA: Diagnosis not present

## 2018-05-30 DIAGNOSIS — K219 Gastro-esophageal reflux disease without esophagitis: Secondary | ICD-10-CM | POA: Diagnosis not present

## 2018-06-12 DIAGNOSIS — J4530 Mild persistent asthma, uncomplicated: Secondary | ICD-10-CM | POA: Diagnosis not present

## 2018-06-12 DIAGNOSIS — M549 Dorsalgia, unspecified: Secondary | ICD-10-CM | POA: Diagnosis not present

## 2018-06-12 DIAGNOSIS — K219 Gastro-esophageal reflux disease without esophagitis: Secondary | ICD-10-CM | POA: Diagnosis not present

## 2018-07-11 DIAGNOSIS — J4530 Mild persistent asthma, uncomplicated: Secondary | ICD-10-CM | POA: Diagnosis not present

## 2018-07-11 DIAGNOSIS — M549 Dorsalgia, unspecified: Secondary | ICD-10-CM | POA: Diagnosis not present

## 2018-07-11 DIAGNOSIS — K219 Gastro-esophageal reflux disease without esophagitis: Secondary | ICD-10-CM | POA: Diagnosis not present

## 2018-11-01 DIAGNOSIS — B351 Tinea unguium: Secondary | ICD-10-CM | POA: Diagnosis not present

## 2018-11-01 DIAGNOSIS — J4530 Mild persistent asthma, uncomplicated: Secondary | ICD-10-CM | POA: Diagnosis not present

## 2018-11-01 DIAGNOSIS — K219 Gastro-esophageal reflux disease without esophagitis: Secondary | ICD-10-CM | POA: Diagnosis not present

## 2019-03-10 DIAGNOSIS — L259 Unspecified contact dermatitis, unspecified cause: Secondary | ICD-10-CM | POA: Diagnosis not present

## 2019-03-21 DIAGNOSIS — Z1389 Encounter for screening for other disorder: Secondary | ICD-10-CM | POA: Diagnosis not present

## 2019-03-21 DIAGNOSIS — Z0001 Encounter for general adult medical examination with abnormal findings: Secondary | ICD-10-CM | POA: Diagnosis not present

## 2019-03-21 DIAGNOSIS — K219 Gastro-esophageal reflux disease without esophagitis: Secondary | ICD-10-CM | POA: Diagnosis not present

## 2019-03-21 DIAGNOSIS — M549 Dorsalgia, unspecified: Secondary | ICD-10-CM | POA: Diagnosis not present

## 2019-03-21 DIAGNOSIS — J4530 Mild persistent asthma, uncomplicated: Secondary | ICD-10-CM | POA: Diagnosis not present

## 2019-03-21 DIAGNOSIS — F17210 Nicotine dependence, cigarettes, uncomplicated: Secondary | ICD-10-CM | POA: Diagnosis not present

## 2019-03-21 DIAGNOSIS — Z1331 Encounter for screening for depression: Secondary | ICD-10-CM | POA: Diagnosis not present

## 2019-04-20 DIAGNOSIS — J4530 Mild persistent asthma, uncomplicated: Secondary | ICD-10-CM | POA: Diagnosis not present

## 2019-04-20 DIAGNOSIS — K219 Gastro-esophageal reflux disease without esophagitis: Secondary | ICD-10-CM | POA: Diagnosis not present

## 2019-05-21 DIAGNOSIS — M549 Dorsalgia, unspecified: Secondary | ICD-10-CM | POA: Diagnosis not present

## 2019-05-21 DIAGNOSIS — K219 Gastro-esophageal reflux disease without esophagitis: Secondary | ICD-10-CM | POA: Diagnosis not present

## 2019-06-20 DIAGNOSIS — K219 Gastro-esophageal reflux disease without esophagitis: Secondary | ICD-10-CM | POA: Diagnosis not present

## 2019-06-20 DIAGNOSIS — M549 Dorsalgia, unspecified: Secondary | ICD-10-CM | POA: Diagnosis not present

## 2019-07-25 DIAGNOSIS — M25519 Pain in unspecified shoulder: Secondary | ICD-10-CM | POA: Diagnosis not present

## 2019-07-25 DIAGNOSIS — J4530 Mild persistent asthma, uncomplicated: Secondary | ICD-10-CM | POA: Diagnosis not present

## 2019-07-25 DIAGNOSIS — K219 Gastro-esophageal reflux disease without esophagitis: Secondary | ICD-10-CM | POA: Diagnosis not present

## 2019-07-25 DIAGNOSIS — Z23 Encounter for immunization: Secondary | ICD-10-CM | POA: Diagnosis not present

## 2019-07-25 DIAGNOSIS — M549 Dorsalgia, unspecified: Secondary | ICD-10-CM | POA: Diagnosis not present

## 2019-08-25 DIAGNOSIS — M549 Dorsalgia, unspecified: Secondary | ICD-10-CM | POA: Diagnosis not present

## 2019-08-25 DIAGNOSIS — J4530 Mild persistent asthma, uncomplicated: Secondary | ICD-10-CM | POA: Diagnosis not present

## 2019-09-02 IMAGING — DX DG CHEST 2V
2 series · 2 of 2 positions shown · non-contrast
Comparison: 07/05/2011

CLINICAL DATA: Shortness of breath. Cough. Chest tightness and
wheezing.

EXAM:
CHEST  2 VIEW

[chest pa]
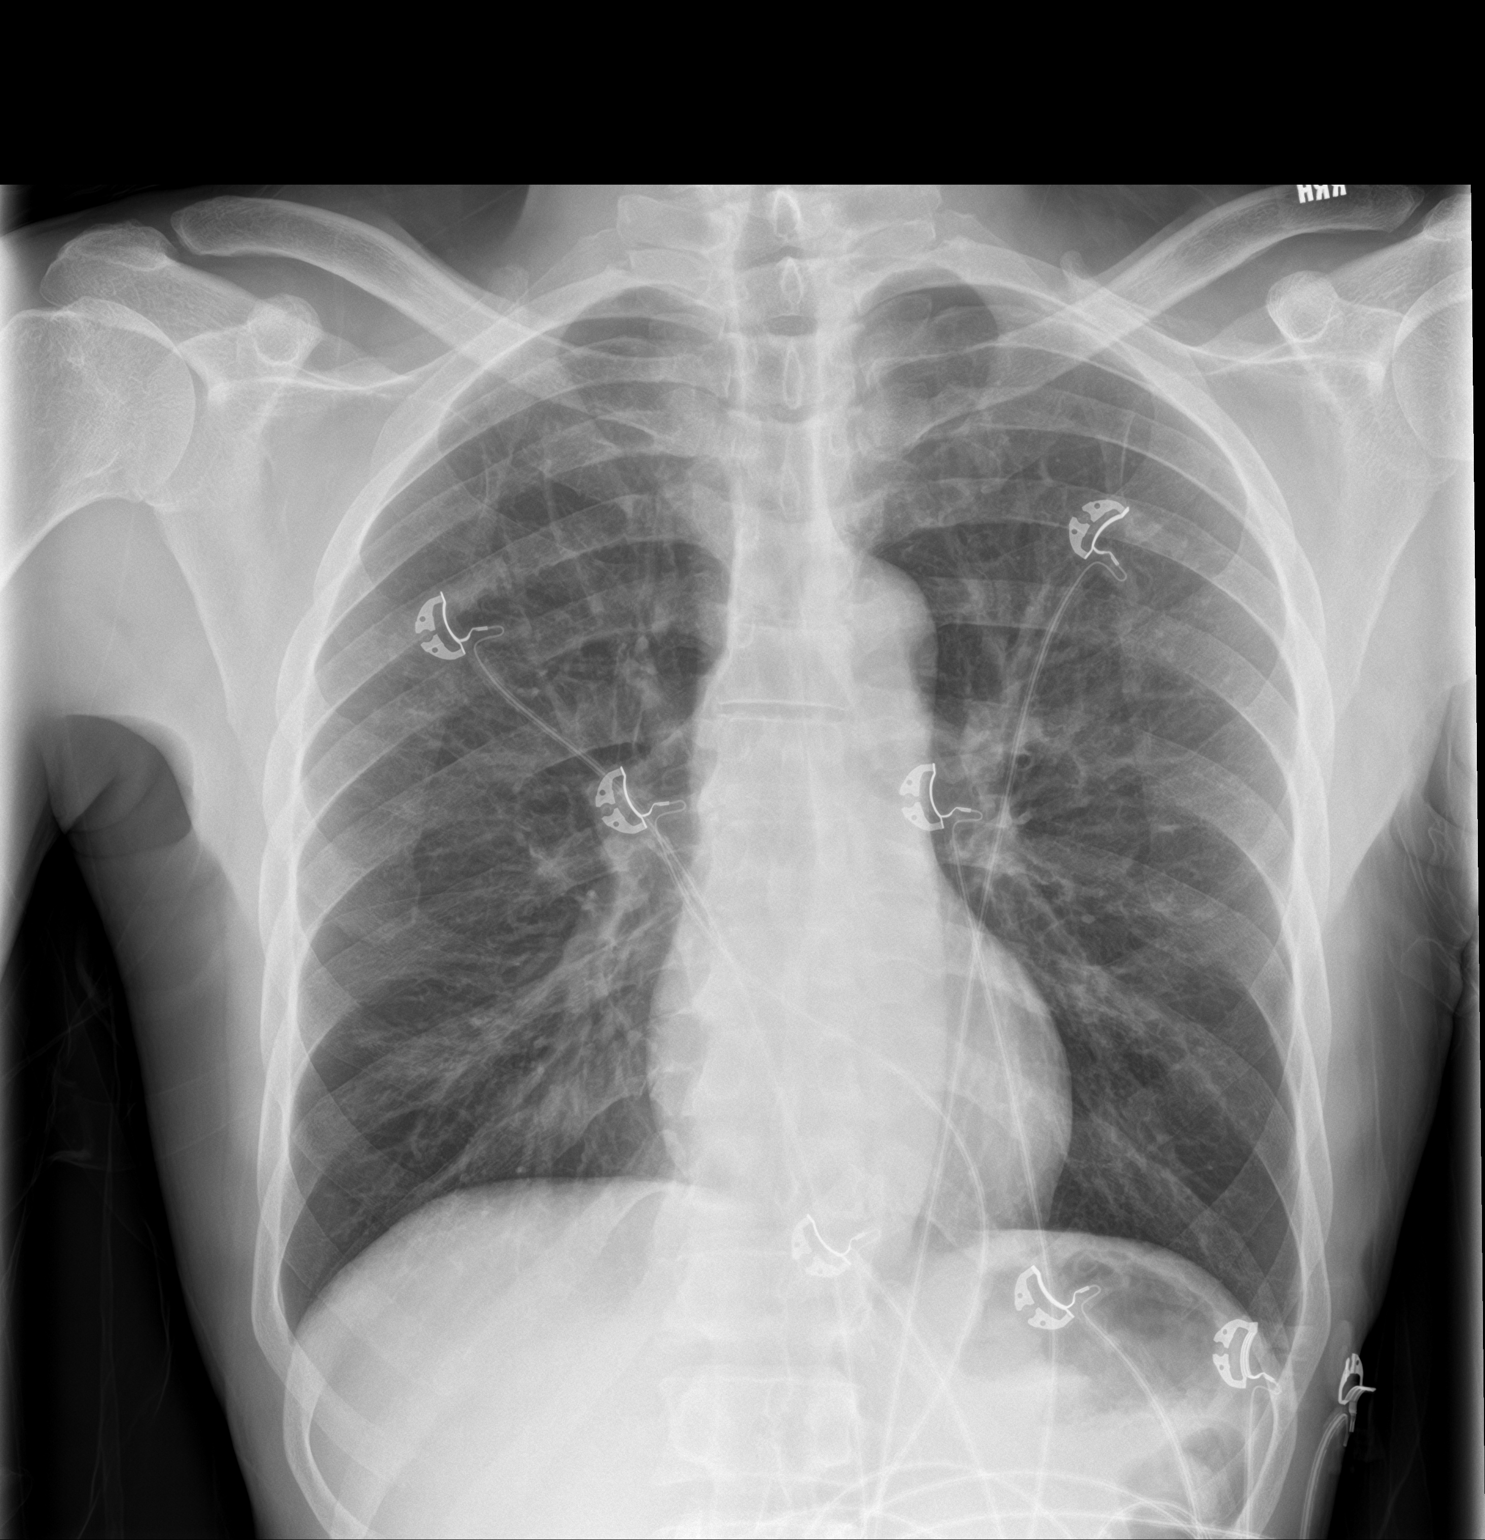

[chest lat]
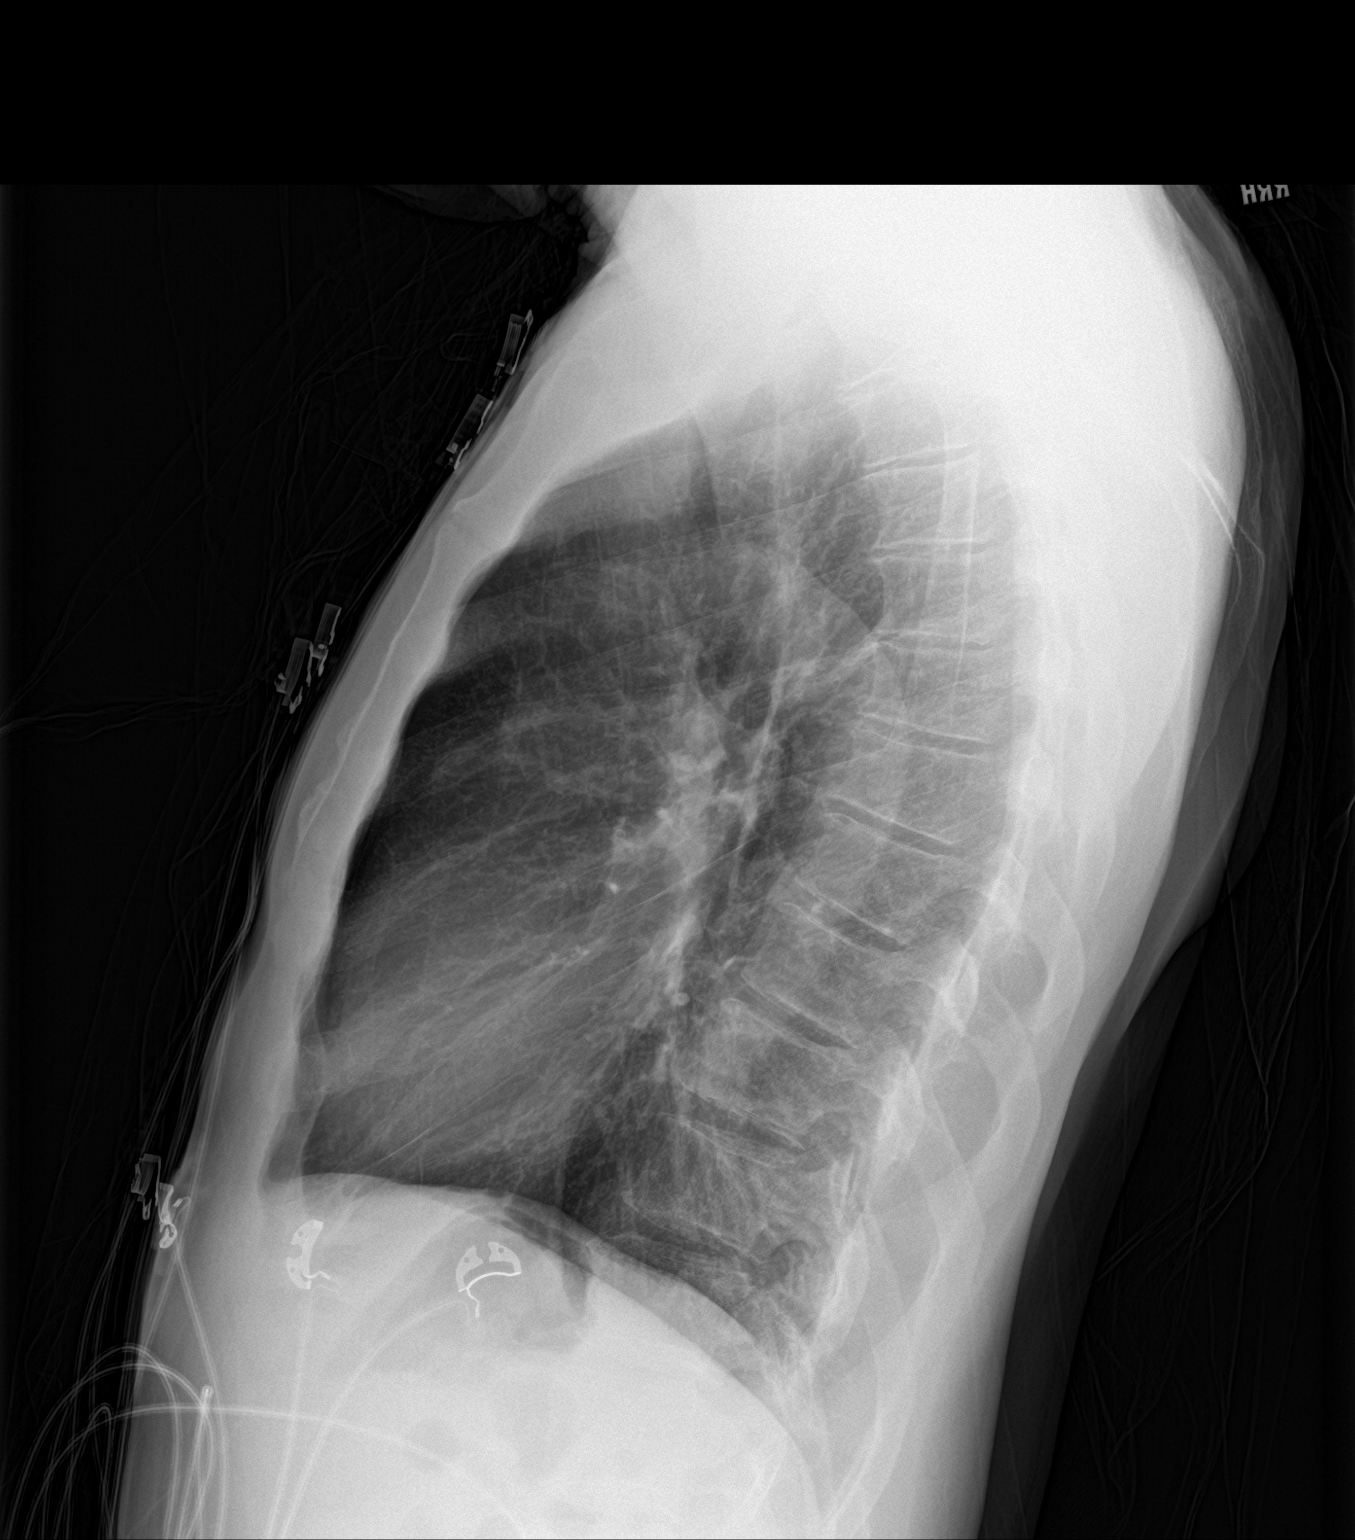

[2 of 2 positions shown; findings below may reference images not displayed]

FINDINGS: The heart size and mediastinal contours are within normal limits.
Both lungs are clear except for peribronchial thickening. No
effusions. The visualized skeletal structures are unremarkable.
IMPRESSION: Slight bronchitic changes.

## 2019-09-22 DIAGNOSIS — M549 Dorsalgia, unspecified: Secondary | ICD-10-CM | POA: Diagnosis not present

## 2019-09-22 DIAGNOSIS — K219 Gastro-esophageal reflux disease without esophagitis: Secondary | ICD-10-CM | POA: Diagnosis not present

## 2019-10-31 DIAGNOSIS — K219 Gastro-esophageal reflux disease without esophagitis: Secondary | ICD-10-CM | POA: Diagnosis not present

## 2019-10-31 DIAGNOSIS — L309 Dermatitis, unspecified: Secondary | ICD-10-CM | POA: Diagnosis not present

## 2019-10-31 DIAGNOSIS — M549 Dorsalgia, unspecified: Secondary | ICD-10-CM | POA: Diagnosis not present

## 2019-10-31 DIAGNOSIS — J4530 Mild persistent asthma, uncomplicated: Secondary | ICD-10-CM | POA: Diagnosis not present

## 2019-12-31 DIAGNOSIS — J4530 Mild persistent asthma, uncomplicated: Secondary | ICD-10-CM | POA: Diagnosis not present

## 2019-12-31 DIAGNOSIS — K219 Gastro-esophageal reflux disease without esophagitis: Secondary | ICD-10-CM | POA: Diagnosis not present

## 2020-01-30 DIAGNOSIS — J4530 Mild persistent asthma, uncomplicated: Secondary | ICD-10-CM | POA: Diagnosis not present

## 2020-01-30 DIAGNOSIS — K219 Gastro-esophageal reflux disease without esophagitis: Secondary | ICD-10-CM | POA: Diagnosis not present

## 2020-02-13 ENCOUNTER — Other Ambulatory Visit (HOSPITAL_COMMUNITY): Payer: Self-pay | Admitting: Internal Medicine

## 2020-02-13 DIAGNOSIS — J4530 Mild persistent asthma, uncomplicated: Secondary | ICD-10-CM | POA: Diagnosis not present

## 2020-02-13 DIAGNOSIS — M79604 Pain in right leg: Secondary | ICD-10-CM

## 2020-02-13 DIAGNOSIS — F17209 Nicotine dependence, unspecified, with unspecified nicotine-induced disorders: Secondary | ICD-10-CM | POA: Diagnosis not present

## 2020-02-13 DIAGNOSIS — M79661 Pain in right lower leg: Secondary | ICD-10-CM | POA: Diagnosis not present

## 2020-02-20 ENCOUNTER — Ambulatory Visit (HOSPITAL_COMMUNITY)
Admission: RE | Admit: 2020-02-20 | Discharge: 2020-02-20 | Disposition: A | Discharge status: Home / Self Care | Payer: Medicare PPO | Source: Ambulatory Visit | Attending: Internal Medicine | Admitting: Internal Medicine

## 2020-02-20 DIAGNOSIS — M79604 Pain in right leg: Secondary | ICD-10-CM | POA: Diagnosis not present

## 2020-02-20 DIAGNOSIS — M79605 Pain in left leg: Secondary | ICD-10-CM | POA: Diagnosis not present

## 2020-02-20 DIAGNOSIS — R0989 Other specified symptoms and signs involving the circulatory and respiratory systems: Secondary | ICD-10-CM | POA: Diagnosis not present

## 2020-03-16 IMAGING — DX DG CHEST 2V
2 series · 2 of 2 positions shown · non-contrast
Comparison: 07/11/2017

CLINICAL DATA: Cough, fever, chest pressure, weakness, and nausea.

EXAM:
CHEST - 2 VIEW

[chest pa]
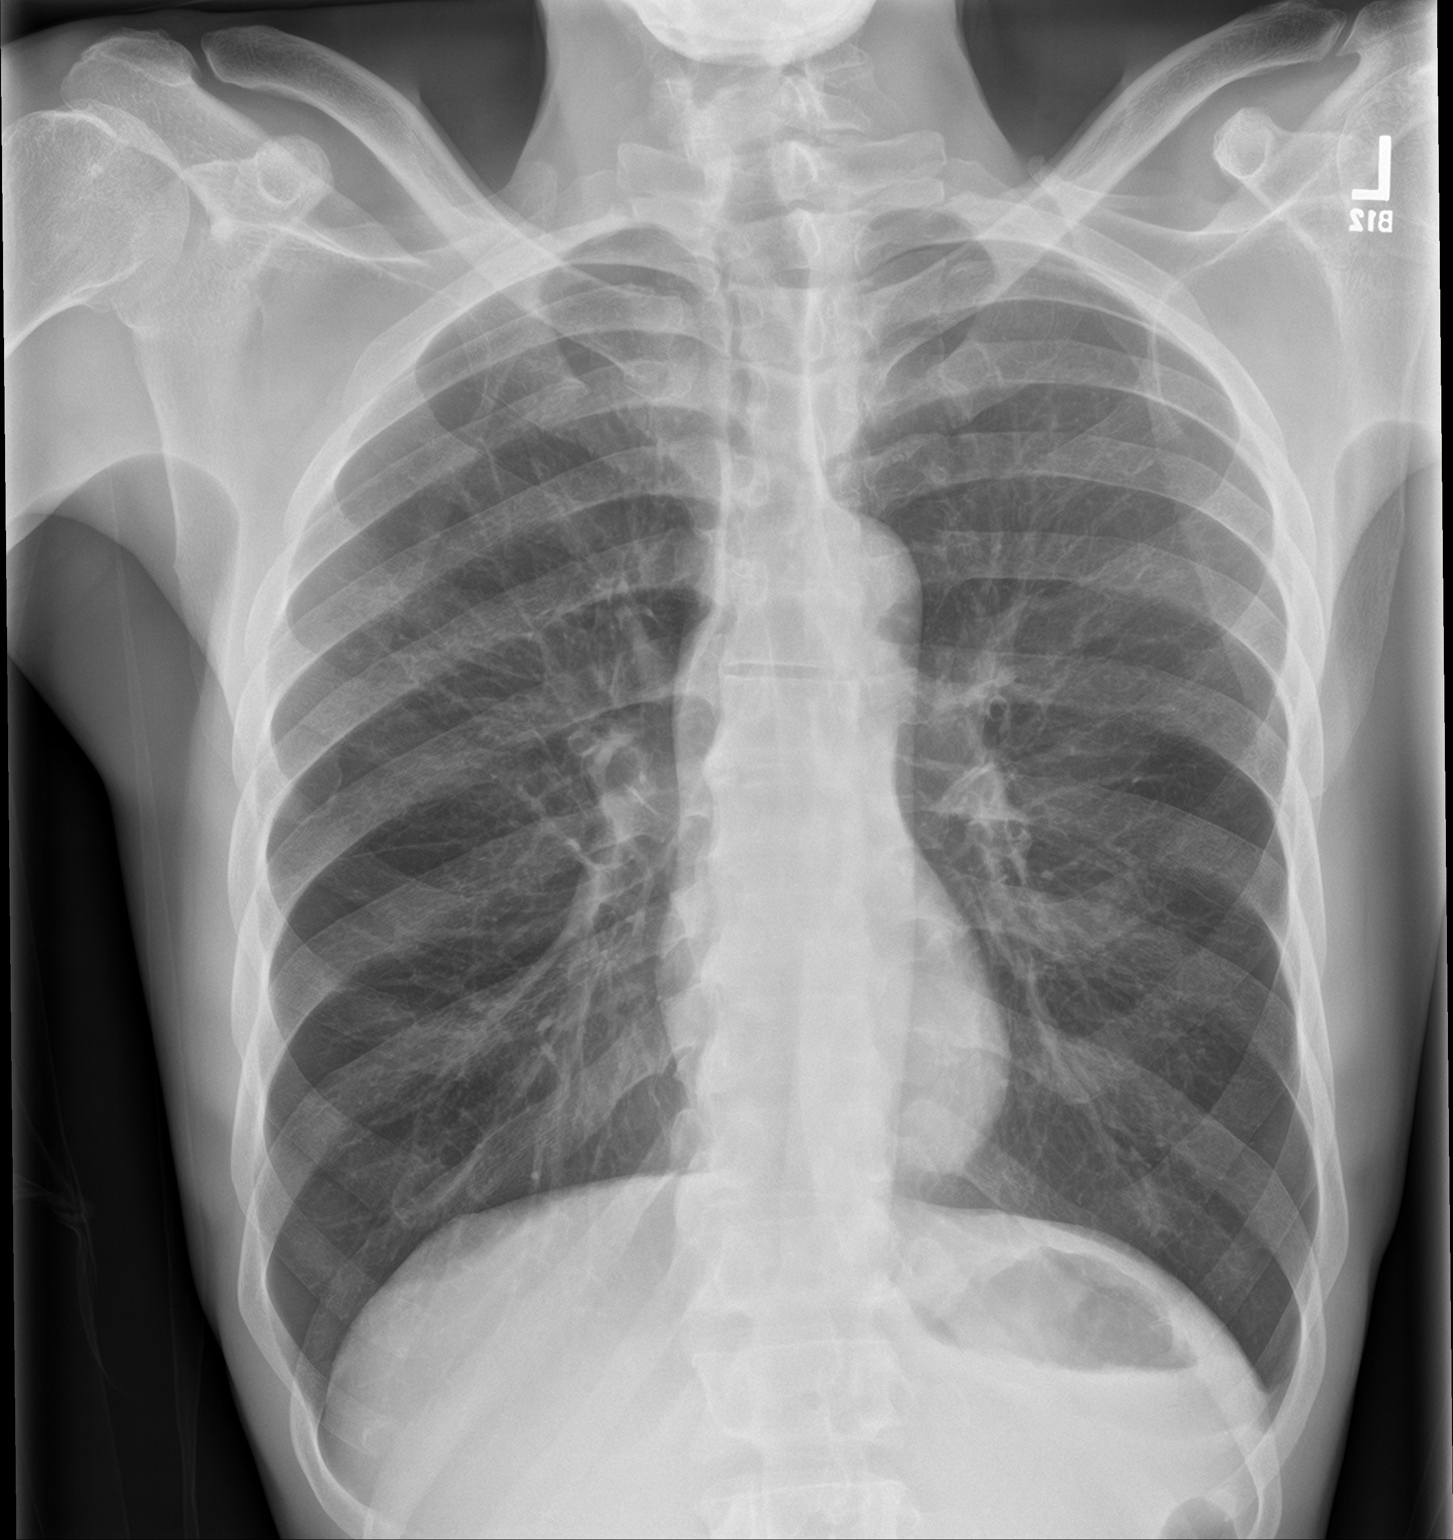

[chest lat]
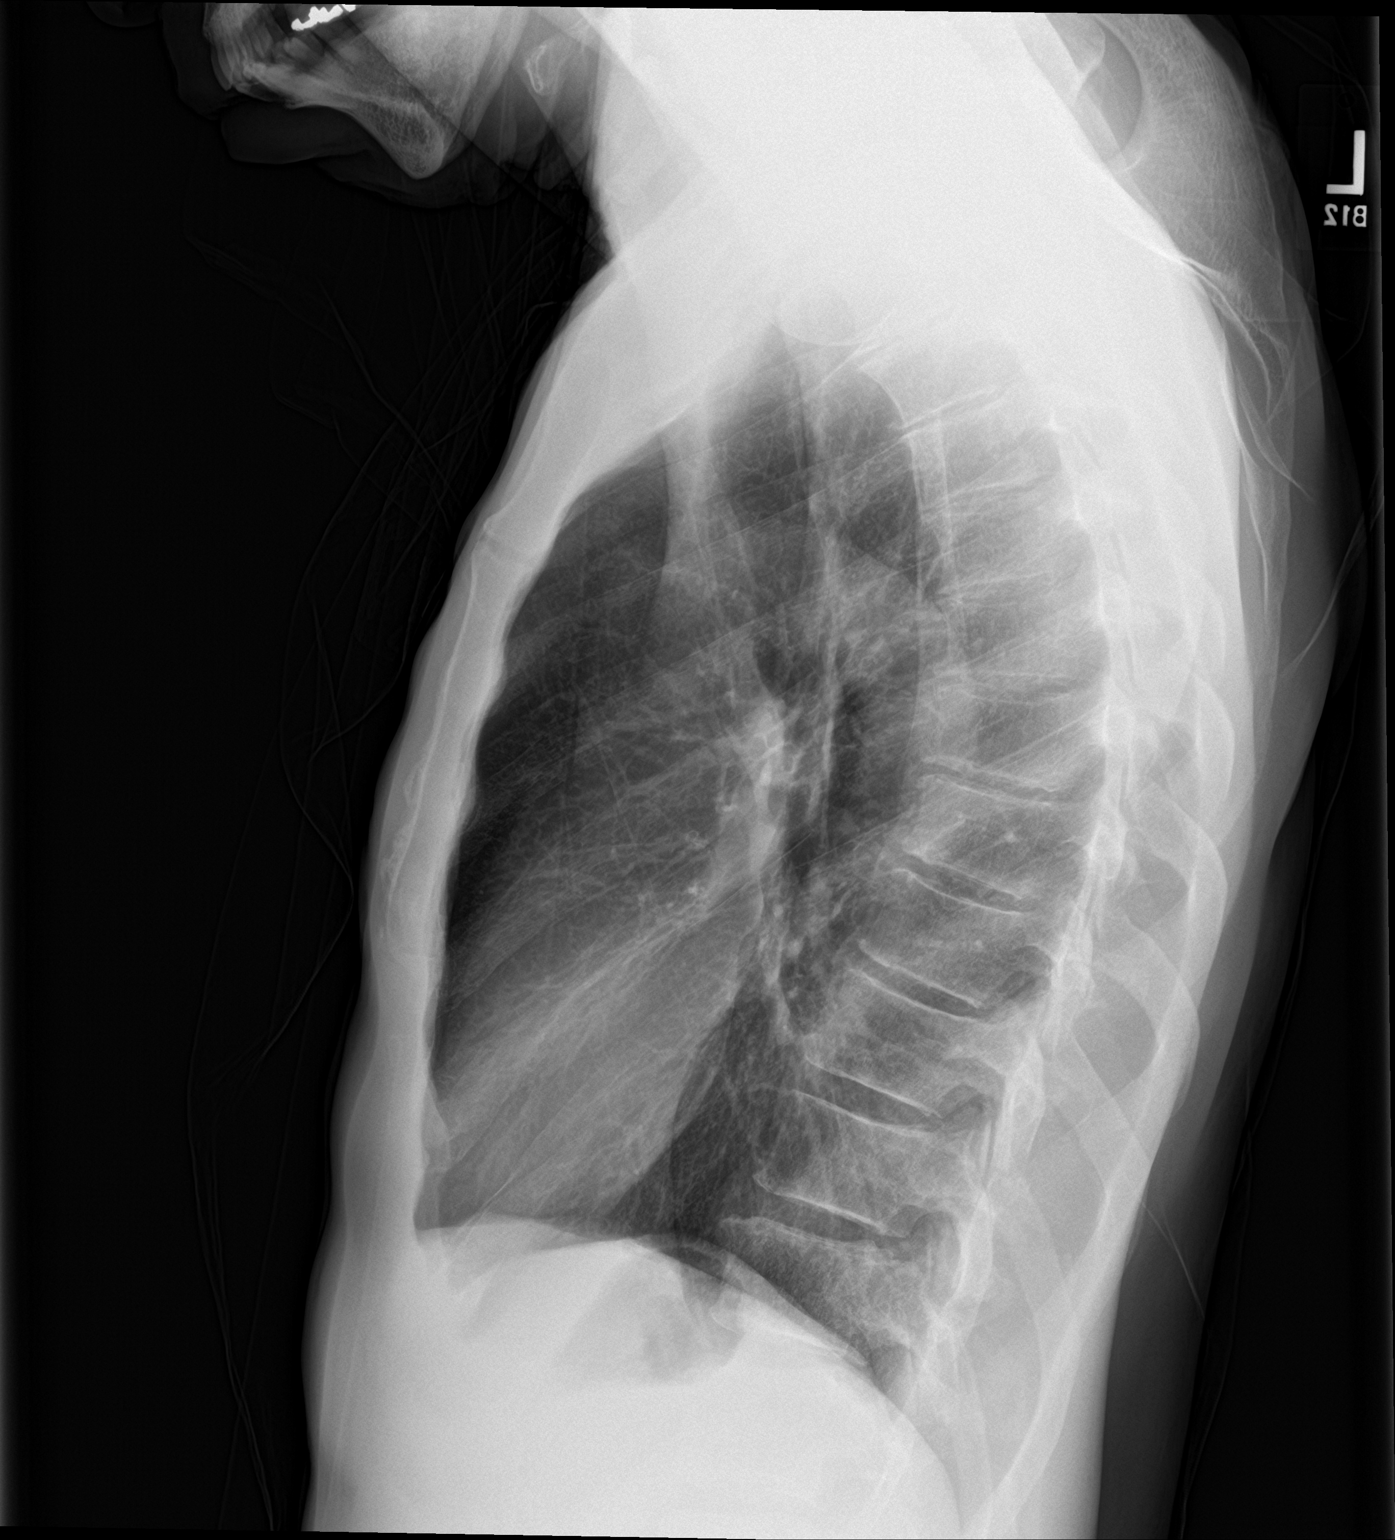

[2 of 2 positions shown; findings below may reference images not displayed]

FINDINGS: The cardiomediastinal silhouette is within normal limits. The lungs
are hyperinflated and clear. No pleural effusion or pneumothorax is
identified. No acute osseous abnormality is seen.
IMPRESSION: No active cardiopulmonary disease.

## 2020-03-23 DIAGNOSIS — Z1331 Encounter for screening for depression: Secondary | ICD-10-CM | POA: Diagnosis not present

## 2020-03-23 DIAGNOSIS — K219 Gastro-esophageal reflux disease without esophagitis: Secondary | ICD-10-CM | POA: Diagnosis not present

## 2020-03-23 DIAGNOSIS — Z1389 Encounter for screening for other disorder: Secondary | ICD-10-CM | POA: Diagnosis not present

## 2020-03-23 DIAGNOSIS — M549 Dorsalgia, unspecified: Secondary | ICD-10-CM | POA: Diagnosis not present

## 2020-03-23 DIAGNOSIS — Z0001 Encounter for general adult medical examination with abnormal findings: Secondary | ICD-10-CM | POA: Diagnosis not present

## 2020-03-23 DIAGNOSIS — J4530 Mild persistent asthma, uncomplicated: Secondary | ICD-10-CM | POA: Diagnosis not present

## 2020-04-15 ENCOUNTER — Encounter (INDEPENDENT_AMBULATORY_CARE_PROVIDER_SITE_OTHER): Payer: Self-pay | Admitting: *Deleted

## 2020-04-22 DIAGNOSIS — K219 Gastro-esophageal reflux disease without esophagitis: Secondary | ICD-10-CM | POA: Diagnosis not present

## 2020-04-22 DIAGNOSIS — J4530 Mild persistent asthma, uncomplicated: Secondary | ICD-10-CM | POA: Diagnosis not present

## 2020-04-27 DIAGNOSIS — M549 Dorsalgia, unspecified: Secondary | ICD-10-CM | POA: Diagnosis not present

## 2020-04-27 DIAGNOSIS — K219 Gastro-esophageal reflux disease without esophagitis: Secondary | ICD-10-CM | POA: Diagnosis not present

## 2020-04-27 DIAGNOSIS — J4530 Mild persistent asthma, uncomplicated: Secondary | ICD-10-CM | POA: Diagnosis not present

## 2020-05-07 ENCOUNTER — Ambulatory Visit
Admission: EM | Admit: 2020-05-07 | Discharge: 2020-05-07 | Disposition: A | Discharge status: Home / Self Care | Payer: Medicare PPO | Attending: Emergency Medicine | Admitting: Emergency Medicine

## 2020-05-07 ENCOUNTER — Encounter: Payer: Self-pay | Admitting: Emergency Medicine

## 2020-05-07 DIAGNOSIS — R110 Nausea: Secondary | ICD-10-CM

## 2020-05-07 DIAGNOSIS — Z1152 Encounter for screening for COVID-19: Secondary | ICD-10-CM

## 2020-05-07 MED ORDER — ONDANSETRON HCL 4 MG PO TABS: 4.0000 mg | ORAL_TABLET | Freq: Four times a day (QID) | ORAL | 0 refills | Status: AC

## 2020-05-07 NOTE — ED Provider Notes (Signed)
Charles Rivers   [id number] 05/07/20 Arrival Time: 1638   CC: COVID symptoms  SUBJECTIVE: History from: patient.  Charles Rivers is a 69 y.o. male who presents with nausea x 1 day.  Daughter tested positive for COVID.  Denies alleviating or aggravating factors.  Denies previous COVID infection in the past.   Denies fever, chills, fatigue, sinus pain, rhinorrhea, sore throat, SOB, wheezing, chest pain, vomiting, changes in bowel or bladder habits.     ROS: As per HPI.  All other pertinent ROS negative.     Past Medical History:  Diagnosis Date  . Asthma   . Chronic back pain   . Hyperlipidemia    Past Surgical History:  Procedure Laterality Date  . Right middle finger reconstruction     No Known Allergies No current facility-administered medications on file prior to encounter.   Current Outpatient Medications on File Prior to Encounter  Medication Sig Dispense Refill  . albuterol (PROVENTIL HFA;VENTOLIN HFA) 108 (90 Base) MCG/ACT inhaler Inhale 1-2 puffs into the lungs every 6 (six) hours as needed for wheezing or shortness of breath. 1 Inhaler 0  . bismuth subsalicylate (PEPTO BISMOL) 262 MG/15ML suspension Take 30 mLs by mouth daily as needed for indigestion.    . diclofenac (VOLTAREN) 75 MG EC tablet Take 1 tablet (75 mg total) by mouth 2 (two) times daily. 12 tablet 0  . ibuprofen (ADVIL,MOTRIN) 200 MG tablet Take 400 mg by mouth daily as needed for headache or moderate pain.    . polyethylene glycol-electrolytes (TRILYTE) 420 g solution Take 4,000 mLs by mouth as directed. 4000 mL 0  . predniSONE (DELTASONE) 50 MG tablet Take one tablet daily for 4 days, starting on 07/12/17 4 tablet 0   Social History   Socioeconomic History  . Marital status: Legally Separated    Spouse name: Not on file  . Number of children: Not on file  . Years of education: Not on file  . Highest education level: Not on file  Occupational History  . Not on file  Tobacco Use  . Smoking  status: Former Smoker    Quit date: 07/27/1967    Years since quitting: 52.8  . Smokeless tobacco: Never Used  Vaping Use  . Vaping Use: Never used  Substance and Sexual Activity  . Alcohol use: Yes    Comment: 2-3 beers a week  . Drug use: Yes    Types: Marijuana    Comment: marajuana many years ago  . Sexual activity: Yes  Other Topics Concern  . Not on file  Social History Narrative  . Not on file   Social Determinants of Health   Financial Resource Strain:   . Difficulty of Paying Living Expenses: Not on file  Food Insecurity:   . Worried About Charity fundraiser in the Last Year: Not on file  . Ran Out of Food in the Last Year: Not on file  Transportation Needs:   . Lack of Transportation (Medical): Not on file  . Lack of Transportation (Non-Medical): Not on file  Physical Activity:   . Days of Exercise per Week: Not on file  . Minutes of Exercise per Session: Not on file  Stress:   . Feeling of Stress : Not on file  Social Connections:   . Frequency of Communication with Friends and Family: Not on file  . Frequency of Social Gatherings with Friends and Family: Not on file  . Attends Religious Services: Not on file  .  Active Member of Clubs or Organizations: Not on file  . Attends Archivist Meetings: Not on file  . Marital Status: Not on file  Intimate Partner Violence:   . Fear of Current or Ex-Partner: Not on file  . Emotionally Abused: Not on file  . Physically Abused: Not on file  . Sexually Abused: Not on file   Family History  Problem Relation Age of Onset  . Diabetes Mother   . Cancer Mother        breast  . Cancer Father   . Asthma Brother     OBJECTIVE:  Vitals:   05/07/20 1808 05/07/20 1817  BP: 129/81   Pulse: 68   Resp: 16   Temp: 98.3 F (36.8 C)   TempSrc: Oral   SpO2: 96%   Weight:  150 lb (68 kg)  Height:  6' (1.829 m)     General appearance: alert; mildly fatigued appearing, nontoxic; speaking in full sentences and  tolerating own secretions HEENT: NCAT; Ears: EACs clear; Eyes: PERRL.  EOM grossly intact.Nose: nares patent without rhinorrhea, Throat: oropharynx clear, tonsils non erythematous or enlarged, uvula midline  Neck: supple without LAD Lungs: unlabored respirations, symmetrical air entry; cough: absent; no respiratory distress; CTAB Heart: regular rate and rhythm.  Abdomen: soft, nondistended, normal active bowel sounds; nontender to palpation; no guarding  Skin: warm and dry Psychological: alert and cooperative; normal mood and affect   ASSESSMENT & PLAN:  1. Encounter for screening for COVID-19   2. Nausea without vomiting     Meds ordered this encounter  Medications  . ondansetron (ZOFRAN) 4 MG tablet    Sig: Take 1 tablet (4 mg total) by mouth every 6 (six) hours.    Dispense:  12 tablet    Refill:  0    Order Specific Question:   Supervising Provider    Answer:   Raylene Everts [[id number]]   COVID testing ordered.  It will take between 5-7 days for test results.  Someone will contact you regarding abnormal results.    In the meantime: You should remain isolated in your home for 10 days from symptom onset AND greater than 72 hours after symptoms resolution (absence of fever without the use of fever-reducing medication and improvement in respiratory symptoms), whichever is longer Get plenty of rest and push fluids Zofran prescribed.  Take as directed  Use medications daily for symptom relief Use OTC medications like ibuprofen or tylenol as needed fever or pain Call or go to the ED if you have any new or worsening symptoms such as fever, cough, shortness of breath, chest tightness, chest pain, turning blue, changes in mental status, etc...   Reviewed expectations re: course of current medical issues. Questions answered. Outlined signs and symptoms indicating need for more acute intervention. Patient verbalized understanding. After Visit Summary given.         Lestine Box, PA-C 05/07/20 1842

## 2020-05-07 NOTE — Discharge Instructions (Signed)
COVID testing ordered.  It will take between 5-7 days for test results.  Someone will contact you regarding abnormal results.    In the meantime: You should remain isolated in your home for 10 days from symptom onset AND greater than 72 hours after symptoms resolution (absence of fever without the use of fever-reducing medication and improvement in respiratory symptoms), whichever is longer Get plenty of rest and push fluids Zofran prescribed.  Take as directed  Use medications daily for symptom relief Use OTC medications like ibuprofen or tylenol as needed fever or pain Call or go to the ED if you have any new or worsening symptoms such as fever, cough, shortness of breath, chest tightness, chest pain, turning blue, changes in mental status, etc..Marland Kitchen

## 2020-05-07 NOTE — ED Triage Notes (Signed)
Reports some nausea that started today.  Daughter is positive for covid

## 2020-05-08 LAB — NOVEL CORONAVIRUS, NAA: SARS-CoV-2, NAA: DETECTED — AB

## 2020-05-08 LAB — SARS-COV-2, NAA 2 DAY TAT

## 2020-05-09 ENCOUNTER — Telehealth: Payer: Self-pay | Admitting: Physician Assistant

## 2020-05-09 ENCOUNTER — Telehealth (HOSPITAL_COMMUNITY): Payer: Self-pay | Admitting: Oncology

## 2020-05-09 NOTE — Telephone Encounter (Signed)
Re: [facility]  Called to Discuss with patient about Covid symptoms and the use of regeneron, a monoclonal antibody infusion for those with mild to moderate Covid symptoms and at a high risk of hospitalization.     Pt is qualified for this infusion at the [place] [facility] center due to co-morbid conditions and/or a member of an at-risk group.    Past Medical History:  Diagnosis Date  . Asthma   . Chronic back pain   . Hyperlipidemia     Specific risk condition-asthma   Unable to reach pt. Sent text message. No VM and no My chart available.   Rulon Abide, AGNP-C [phone] ([place])

## 2020-05-09 NOTE — Telephone Encounter (Signed)
Called to discuss with Charles Rivers about Covid symptoms and the use of casirivimab/imdevimab or bamlanivimab/etesevimab infusion for those with mild to moderate Covid symptoms and at a high risk of hospitalization.    Pt does not qualify for infusion therapy as pt has asymptomatic infection. Isolation precautions discussed. Advised to contact back for consideration should they develop symptoms. Patient verbalized understanding. He will call us if he develops symptoms.      Patient Active Problem List   Diagnosis Date Noted  . GERD (gastroesophageal reflux disease) 10/31/2013  . Nausea 10/31/2013  . Nail fungus 10/01/2013  . Hyperlipidemia 01/08/2013  . Bulging lumbar disc 01/08/2013  . Positive fecal occult blood test 01/08/2013  . Marijuana abuse 06/21/2012  . Lipoma 08/22/2011  . Dyspepsia 04/30/2011  . Asthma 02/15/2011  . Back pain with radiation 02/15/2011  . Erectile dysfunction 02/15/2011    Angelena Form PA-C

## 2020-05-28 DIAGNOSIS — M549 Dorsalgia, unspecified: Secondary | ICD-10-CM | POA: Diagnosis not present

## 2020-05-28 DIAGNOSIS — K219 Gastro-esophageal reflux disease without esophagitis: Secondary | ICD-10-CM | POA: Diagnosis not present

## 2020-06-27 DIAGNOSIS — J4530 Mild persistent asthma, uncomplicated: Secondary | ICD-10-CM | POA: Diagnosis not present

## 2020-06-27 DIAGNOSIS — K219 Gastro-esophageal reflux disease without esophagitis: Secondary | ICD-10-CM | POA: Diagnosis not present

## 2020-07-28 DIAGNOSIS — K219 Gastro-esophageal reflux disease without esophagitis: Secondary | ICD-10-CM | POA: Diagnosis not present

## 2020-07-28 DIAGNOSIS — M549 Dorsalgia, unspecified: Secondary | ICD-10-CM | POA: Diagnosis not present

## 2020-08-03 DIAGNOSIS — M542 Cervicalgia: Secondary | ICD-10-CM | POA: Diagnosis not present

## 2020-08-03 DIAGNOSIS — I10 Essential (primary) hypertension: Secondary | ICD-10-CM | POA: Diagnosis not present

## 2020-08-03 DIAGNOSIS — K219 Gastro-esophageal reflux disease without esophagitis: Secondary | ICD-10-CM | POA: Diagnosis not present

## 2020-08-05 ENCOUNTER — Encounter (HOSPITAL_COMMUNITY): Payer: Self-pay

## 2020-08-05 ENCOUNTER — Emergency Department (HOSPITAL_COMMUNITY)
Admission: EM | Admit: 2020-08-05 | Discharge: 2020-08-05 | Disposition: A | Discharge status: Home / Self Care | Payer: Medicare PPO | Attending: Emergency Medicine | Admitting: Emergency Medicine

## 2020-08-05 ENCOUNTER — Other Ambulatory Visit: Payer: Self-pay

## 2020-08-05 ENCOUNTER — Emergency Department (HOSPITAL_COMMUNITY): Payer: Medicare PPO

## 2020-08-05 DIAGNOSIS — R030 Elevated blood-pressure reading, without diagnosis of hypertension: Secondary | ICD-10-CM

## 2020-08-05 DIAGNOSIS — J45909 Unspecified asthma, uncomplicated: Secondary | ICD-10-CM | POA: Insufficient documentation

## 2020-08-05 DIAGNOSIS — Y92410 Unspecified street and highway as the place of occurrence of the external cause: Secondary | ICD-10-CM | POA: Diagnosis not present

## 2020-08-05 DIAGNOSIS — M5441 Lumbago with sciatica, right side: Secondary | ICD-10-CM | POA: Diagnosis not present

## 2020-08-05 DIAGNOSIS — Z87891 Personal history of nicotine dependence: Secondary | ICD-10-CM | POA: Diagnosis not present

## 2020-08-05 DIAGNOSIS — M545 Low back pain, unspecified: Secondary | ICD-10-CM | POA: Diagnosis not present

## 2020-08-05 MED ORDER — METHOCARBAMOL 500 MG PO TABS: 500.0000 mg | ORAL_TABLET | Freq: Two times a day (BID) | ORAL | 0 refills | Status: AC

## 2020-08-05 MED ORDER — LIDOCAINE 5 % EX PTCH: 1.0000 | MEDICATED_PATCH | CUTANEOUS | 0 refills | Status: AC

## 2020-08-05 NOTE — Discharge Instructions (Addendum)
At this time there does not appear to be the presence of an emergent medical condition, however there is always the potential for conditions to change. Please read and follow the below instructions.  Please return to the Emergency Department immediately for any new or worsening symptoms. Please be sure to follow up with your Primary Care Provider within one week regarding your visit today; please call their office to schedule an appointment even if you are feeling better for a follow-up visit. You may use the Lidoderm patch as prescribed to help with your symptoms.  Lidoderm may be expensive so you may speak with your pharmacist about finding over-the-counter medications that work similarly such as [place]. You may use the muscle relaxer Robaxin as prescribed to help with your symptoms.  Do not drive or operate heavy machinery while taking Robaxin as it will make you drowsy.  Do not drink alcohol or take other sedating medications while taking Robaxin as this will worsen side effects. Your blood pressure was elevated in the emergency department today.  Please have recheck by your primary care provider within 1 week and discuss medication management at that time.  Please take your daily medications as prescribed by your primary care provider.  Go to the nearest Emergency Department immediately if: You have fever or chills You have: Loss of feeling (numbness), tingling, or weakness in your arms or legs. Very bad neck pain, especially tenderness in the middle of the back of your neck. A change in your ability to control your pee or poop (stool). More pain in any area of your body. Swelling in any area of your body, especially your legs. Shortness of breath or light-headedness. Chest pain. Blood in your pee, poop, or vomit. Very bad pain in your belly (abdomen) or your back. Very bad headaches or headaches that are getting worse. Sudden vision loss or double vision. Your eye suddenly turns  red. The black center of your eye (pupil) is an odd shape or size. You cannot control when you pee (urinate) or poop (have a bowel movement). You have weakness in any of these areas and it gets worse: Lower back. The area between your hip bones. Butt. Legs. You have redness or swelling of your back. You have a burning feeling when you pee. You have any new/concerning or worsening of symptoms    Please read the additional information packets attached to your discharge summary.  Do not take your medicine if  develop an itchy rash, swelling in your mouth or lips, or difficulty breathing; call 911 and seek immediate emergency medical attention if this occurs.  You may review your lab tests and imaging results in their entirety on your MyChart account.  Please discuss all results of fully with your primary care provider and other specialist at your follow-up visit.  Note: Portions of this text may have been transcribed using voice recognition software. Every effort was made to ensure accuracy; however, inadvertent computerized transcription errors may still be present.
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
## 2020-08-05 NOTE — ED Notes (Signed)
Notified PA of pt, no further orders at this time.
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
## 2020-08-09 DIAGNOSIS — M25512 Pain in left shoulder: Secondary | ICD-10-CM | POA: Diagnosis not present

## 2020-09-03 DIAGNOSIS — Z0001 Encounter for general adult medical examination with abnormal findings: Secondary | ICD-10-CM | POA: Diagnosis not present

## 2020-09-03 DIAGNOSIS — K219 Gastro-esophageal reflux disease without esophagitis: Secondary | ICD-10-CM | POA: Diagnosis not present

## 2020-09-03 DIAGNOSIS — G4762 Sleep related leg cramps: Secondary | ICD-10-CM | POA: Diagnosis not present

## 2020-09-03 DIAGNOSIS — M549 Dorsalgia, unspecified: Secondary | ICD-10-CM | POA: Diagnosis not present

## 2020-09-03 DIAGNOSIS — Z79899 Other long term (current) drug therapy: Secondary | ICD-10-CM | POA: Diagnosis not present

## 2020-10-07 ENCOUNTER — Ambulatory Visit (HOSPITAL_COMMUNITY)
Admission: RE | Admit: 2020-10-07 | Discharge: 2020-10-07 | Disposition: A | Discharge status: Home / Self Care | Payer: Medicare PPO | Source: Ambulatory Visit | Attending: Gerontology | Admitting: Gerontology

## 2020-10-07 ENCOUNTER — Other Ambulatory Visit: Payer: Self-pay

## 2020-10-07 ENCOUNTER — Other Ambulatory Visit (HOSPITAL_COMMUNITY): Payer: Self-pay | Admitting: Gerontology

## 2020-10-07 DIAGNOSIS — M545 Low back pain, unspecified: Secondary | ICD-10-CM

## 2020-10-07 DIAGNOSIS — M549 Dorsalgia, unspecified: Secondary | ICD-10-CM | POA: Diagnosis not present

## 2020-10-07 DIAGNOSIS — M542 Cervicalgia: Secondary | ICD-10-CM

## 2020-11-04 ENCOUNTER — Ambulatory Visit (INDEPENDENT_AMBULATORY_CARE_PROVIDER_SITE_OTHER): Payer: Medicare HMO | Admitting: Orthopaedic Surgery

## 2020-11-04 ENCOUNTER — Other Ambulatory Visit: Payer: Self-pay

## 2020-11-04 ENCOUNTER — Ambulatory Visit: Payer: Medicare HMO

## 2020-11-04 ENCOUNTER — Encounter: Payer: Self-pay | Admitting: Orthopaedic Surgery

## 2020-11-04 VITALS — BP 129/85 | HR 60 | Ht 72.0 in | Wt 132.8 lb

## 2020-11-04 DIAGNOSIS — M542 Cervicalgia: Secondary | ICD-10-CM | POA: Diagnosis not present

## 2020-11-04 DIAGNOSIS — M25511 Pain in right shoulder: Secondary | ICD-10-CM

## 2020-11-04 DIAGNOSIS — G8929 Other chronic pain: Secondary | ICD-10-CM | POA: Diagnosis not present

## 2020-11-04 MED ORDER — NAPROXEN 500 MG PO TABS: 500.0000 mg | ORAL_TABLET | Freq: Two times a day (BID) | ORAL | 5 refills | Status: AC
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
## 2020-11-04 NOTE — Patient Instructions (Signed)
While we are working on your approval please go ahead and call to schedule your appointment with [facility] in at least one (1) week.  ° °Central Scheduling °[phone] °

## 2020-11-06 DIAGNOSIS — F17210 Nicotine dependence, cigarettes, uncomplicated: Secondary | ICD-10-CM | POA: Diagnosis not present

## 2020-11-06 DIAGNOSIS — M5030 Other cervical disc degeneration, unspecified cervical region: Secondary | ICD-10-CM | POA: Diagnosis not present

## 2020-11-19 ENCOUNTER — Ambulatory Visit (HOSPITAL_COMMUNITY)
Admission: RE | Admit: 2020-11-19 | Discharge: 2020-11-19 | Disposition: A | Discharge status: Home / Self Care | Payer: Medicare HMO | Source: Ambulatory Visit | Attending: Orthopaedic Surgery | Admitting: Orthopaedic Surgery

## 2020-11-19 DIAGNOSIS — G8929 Other chronic pain: Secondary | ICD-10-CM | POA: Diagnosis not present

## 2020-11-19 DIAGNOSIS — M19011 Primary osteoarthritis, right shoulder: Secondary | ICD-10-CM | POA: Diagnosis not present

## 2020-11-19 DIAGNOSIS — M75101 Unspecified rotator cuff tear or rupture of right shoulder, not specified as traumatic: Secondary | ICD-10-CM | POA: Diagnosis not present

## 2020-11-19 DIAGNOSIS — M25511 Pain in right shoulder: Secondary | ICD-10-CM | POA: Insufficient documentation

## 2020-11-25 ENCOUNTER — Ambulatory Visit (INDEPENDENT_AMBULATORY_CARE_PROVIDER_SITE_OTHER): Payer: Medicare HMO | Admitting: Orthopaedic Surgery

## 2020-11-25 ENCOUNTER — Encounter: Payer: Self-pay | Admitting: Orthopaedic Surgery

## 2020-11-25 ENCOUNTER — Other Ambulatory Visit: Payer: Self-pay

## 2020-11-25 VITALS — BP 106/77 | HR 62 | Ht 72.0 in | Wt 132.4 lb

## 2020-11-25 DIAGNOSIS — G8929 Other chronic pain: Secondary | ICD-10-CM | POA: Diagnosis not present

## 2020-11-25 DIAGNOSIS — M25511 Pain in right shoulder: Secondary | ICD-10-CM

## 2020-11-25 MED ORDER — HYDROCODONE-ACETAMINOPHEN 5-325 MG PO TABS: ORAL_TABLET | ORAL | 0 refills | Status: DC

## 2020-11-25 NOTE — Progress Notes (Signed)
MRI of the right shoulder showed:  IMPRESSION: Severe tendinosis of the supraspinatus tendon with high-grade bursal sided tear at the footprint. Severe tendinosis of the infraspinatus tendon with interstitial tearing and bursal sided fraying. No significant muscle atrophy.  Superior labral fraying without displaced labral tear.   He does not want consideration of any surgery.  PROCEDURE NOTE:  The patient request injection, verbal consent was obtained.  The right shoulder was prepped appropriately after time out was performed.   Sterile technique was observed and injection of 1 cc of Celestone 6 mg with several cc's of plain xylocaine. Anesthesia was provided by ethyl chloride and a 20-gauge needle was used to inject the shoulder area. A posterior approach was used.  The injection was tolerated well.  A band aid dressing was applied.  The patient was advised to apply ice later today and tomorrow to the injection sight as needed.  I have reviewed the [place] web site prior to prescribing narcotic medicine for this patient.   Return in one month.  Call if any problem.  Precautions discussed.   Electronically Signed Sanjuana Kava, MD 5/19/20229:40 AM

## 2020-12-06 DIAGNOSIS — F17210 Nicotine dependence, cigarettes, uncomplicated: Secondary | ICD-10-CM | POA: Diagnosis not present

## 2020-12-06 DIAGNOSIS — K219 Gastro-esophageal reflux disease without esophagitis: Secondary | ICD-10-CM | POA: Diagnosis not present

## 2020-12-10 ENCOUNTER — Telehealth: Payer: Self-pay | Admitting: Orthopaedic Surgery

## 2020-12-30 ENCOUNTER — Other Ambulatory Visit: Payer: Self-pay

## 2020-12-30 ENCOUNTER — Encounter: Payer: Self-pay | Admitting: Orthopaedic Surgery

## 2020-12-30 ENCOUNTER — Ambulatory Visit (INDEPENDENT_AMBULATORY_CARE_PROVIDER_SITE_OTHER): Payer: Medicare HMO | Admitting: Orthopaedic Surgery

## 2020-12-30 DIAGNOSIS — G8929 Other chronic pain: Secondary | ICD-10-CM

## 2020-12-30 DIAGNOSIS — M25511 Pain in right shoulder: Secondary | ICD-10-CM

## 2020-12-30 MED ORDER — HYDROCODONE-ACETAMINOPHEN 5-325 MG PO TABS: ORAL_TABLET | ORAL | 0 refills | Status: DC

## 2020-12-30 NOTE — Progress Notes (Signed)
PROCEDURE NOTE:  The patient request injection, verbal consent was obtained.  The right shoulder was prepped appropriately after time out was performed.   Sterile technique was observed and injection of 1 cc of DepoMedrol 40mg  with several cc's of plain xylocaine. Anesthesia was provided by ethyl chloride and a 20-gauge needle was used to inject the shoulder area. A posterior approach was used.  The injection was tolerated well.  A band aid dressing was applied.  The patient was advised to apply ice later today and tomorrow to the injection sight as needed.  I have reviewed the [place] web site prior to prescribing narcotic medicine for this patient.  Return in one month.  Call if any problem.  Precautions discussed.  Electronically Signed Sanjuana Kava, MD 6/23/202210:26 AM

## 2021-01-06 DIAGNOSIS — E785 Hyperlipidemia, unspecified: Secondary | ICD-10-CM | POA: Diagnosis not present

## 2021-01-06 DIAGNOSIS — K219 Gastro-esophageal reflux disease without esophagitis: Secondary | ICD-10-CM | POA: Diagnosis not present

## 2021-01-12 DIAGNOSIS — M79604 Pain in right leg: Secondary | ICD-10-CM | POA: Diagnosis not present

## 2021-01-12 DIAGNOSIS — R252 Cramp and spasm: Secondary | ICD-10-CM | POA: Diagnosis not present

## 2021-01-26 ENCOUNTER — Telehealth: Payer: Self-pay | Admitting: Orthopaedic Surgery

## 2021-01-27 ENCOUNTER — Ambulatory Visit: Payer: Medicare HMO | Admitting: Orthopaedic Surgery

## 2021-02-01 ENCOUNTER — Ambulatory Visit (INDEPENDENT_AMBULATORY_CARE_PROVIDER_SITE_OTHER): Payer: Medicare HMO | Admitting: Orthopaedic Surgery

## 2021-02-01 ENCOUNTER — Encounter: Payer: Self-pay | Admitting: Orthopaedic Surgery

## 2021-02-01 ENCOUNTER — Other Ambulatory Visit: Payer: Self-pay

## 2021-02-01 DIAGNOSIS — G8929 Other chronic pain: Secondary | ICD-10-CM

## 2021-02-01 DIAGNOSIS — M25511 Pain in right shoulder: Secondary | ICD-10-CM

## 2021-02-01 NOTE — Progress Notes (Signed)
PROCEDURE NOTE:  The patient request injection, verbal consent was obtained.  The right shoulder was prepped appropriately after time out was performed.   Sterile technique was observed and injection of 1 cc of Celestone 6 mg with several cc's of plain xylocaine. Anesthesia was provided by ethyl chloride and a 20-gauge needle was used to inject the shoulder area. A posterior approach was used.  The injection was tolerated well.  A band aid dressing was applied.  The patient was advised to apply ice later today and tomorrow to the injection sight as needed.   Return in one month.  Call if any problem.  Precautions discussed.  Electronically Signed Sanjuana Kava, MD 7/26/20229:50 AM

## 2021-02-12 DIAGNOSIS — K219 Gastro-esophageal reflux disease without esophagitis: Secondary | ICD-10-CM | POA: Diagnosis not present

## 2021-02-12 DIAGNOSIS — M549 Dorsalgia, unspecified: Secondary | ICD-10-CM | POA: Diagnosis not present

## 2021-03-03 ENCOUNTER — Other Ambulatory Visit: Payer: Self-pay | Admitting: Orthopaedic Surgery

## 2021-03-03 NOTE — Telephone Encounter (Signed)
Patient came to office to request Dr Luna Glasgow to refill his medication; aware Dr Luna Glasgow is out of clinic this week, and aware that he has an appointment with Dr Luna Glasgow Tuesday, 03/08/21.  HYDROcodone-acetaminophen (NORCO/VICODIN) 5-325 MG tablet 22 tablet            Pharmacy: [facility]

## 2021-03-07 ENCOUNTER — Telehealth: Payer: Self-pay | Admitting: Orthopaedic Surgery

## 2021-03-08 ENCOUNTER — Other Ambulatory Visit: Payer: Self-pay

## 2021-03-08 ENCOUNTER — Encounter: Payer: Self-pay | Admitting: Orthopaedic Surgery

## 2021-03-08 ENCOUNTER — Ambulatory Visit (INDEPENDENT_AMBULATORY_CARE_PROVIDER_SITE_OTHER): Payer: Medicare HMO | Admitting: Orthopaedic Surgery

## 2021-03-08 DIAGNOSIS — M25511 Pain in right shoulder: Secondary | ICD-10-CM | POA: Diagnosis not present

## 2021-03-08 DIAGNOSIS — G8929 Other chronic pain: Secondary | ICD-10-CM

## 2021-03-08 MED ORDER — HYDROCODONE-ACETAMINOPHEN 5-325 MG PO TABS: ORAL_TABLET | ORAL | 0 refills | Status: DC

## 2021-03-08 NOTE — Progress Notes (Signed)
PROCEDURE NOTE:  The patient request injection, verbal consent was obtained.  The right shoulder was prepped appropriately after time out was performed.   Sterile technique was observed and injection of 1 cc of Celestone 6 mg with several cc's of plain xylocaine. Anesthesia was provided by ethyl chloride and a 20-gauge needle was used to inject the shoulder area. A posterior approach was used.  The injection was tolerated well.  A band aid dressing was applied.  The patient was advised to apply ice later today and tomorrow to the injection sight as needed.   I have reviewed the [place] web site prior to prescribing narcotic medicine for this patient.  Return in one month.  Call if any problem.  Precautions discussed.  Electronically Signed Sanjuana Kava, MD 8/30/202211:01 AM

## 2021-03-15 DIAGNOSIS — M5030 Other cervical disc degeneration, unspecified cervical region: Secondary | ICD-10-CM | POA: Diagnosis not present

## 2021-03-15 DIAGNOSIS — Z1389 Encounter for screening for other disorder: Secondary | ICD-10-CM | POA: Diagnosis not present

## 2021-03-15 DIAGNOSIS — Z1331 Encounter for screening for depression: Secondary | ICD-10-CM | POA: Diagnosis not present

## 2021-03-15 DIAGNOSIS — I10 Essential (primary) hypertension: Secondary | ICD-10-CM | POA: Diagnosis not present

## 2021-03-15 DIAGNOSIS — Z0001 Encounter for general adult medical examination with abnormal findings: Secondary | ICD-10-CM | POA: Diagnosis not present

## 2021-03-15 DIAGNOSIS — J4530 Mild persistent asthma, uncomplicated: Secondary | ICD-10-CM | POA: Diagnosis not present

## 2021-03-18 ENCOUNTER — Other Ambulatory Visit: Payer: Self-pay

## 2021-03-18 ENCOUNTER — Other Ambulatory Visit (HOSPITAL_COMMUNITY)
Admission: RE | Admit: 2021-03-18 | Discharge: 2021-03-18 | Disposition: A | Discharge status: Home / Self Care | Payer: Medicare HMO | Source: Ambulatory Visit | Attending: Internal Medicine | Admitting: Internal Medicine

## 2021-03-18 DIAGNOSIS — I10 Essential (primary) hypertension: Secondary | ICD-10-CM | POA: Insufficient documentation

## 2021-03-18 DIAGNOSIS — E785 Hyperlipidemia, unspecified: Secondary | ICD-10-CM | POA: Diagnosis not present

## 2021-03-18 DIAGNOSIS — R7303 Prediabetes: Secondary | ICD-10-CM | POA: Insufficient documentation

## 2021-03-18 LAB — CBC WITH DIFFERENTIAL/PLATELET
Abs Immature Granulocytes: 0.01 10*3/uL (ref 0.00–0.07)
Basophils Absolute: 0 10*3/uL (ref 0.0–0.1)
Basophils Relative: 1 %
Eosinophils Absolute: 0.5 10*3/uL (ref 0.0–0.5)
Eosinophils Relative: 9 %
HCT: 42.1 % (ref 39.0–52.0)
Hemoglobin: 13.7 g/dL (ref 13.0–17.0)
Immature Granulocytes: 0 %
Lymphocytes Relative: 35 %
Lymphs Abs: 1.8 10*3/uL (ref 0.7–4.0)
MCH: 30.4 pg (ref 26.0–34.0)
MCHC: 32.5 g/dL (ref 30.0–36.0)
MCV: 93.6 fL (ref 80.0–100.0)
Monocytes Absolute: 0.5 10*3/uL (ref 0.1–1.0)
Monocytes Relative: 9 %
Neutro Abs: 2.5 10*3/uL (ref 1.7–7.7)
Neutrophils Relative %: 46 %
Platelets: 246 10*3/uL (ref 150–400)
RBC: 4.5 MIL/uL (ref 4.22–5.81)
RDW: 12.7 % (ref 11.5–15.5)
WBC: 5.3 10*3/uL (ref 4.0–10.5)
nRBC: 0 % (ref 0.0–0.2)

## 2021-03-18 LAB — HEPATIC FUNCTION PANEL
ALT: 18 U/L (ref 0–44)
AST: 21 U/L (ref 15–41)
Albumin: 3.8 g/dL (ref 3.5–5.0)
Alkaline Phosphatase: 45 U/L (ref 38–126)
Bilirubin, Direct: 0.1 mg/dL (ref 0.0–0.2)
Indirect Bilirubin: 0.5 mg/dL (ref 0.3–0.9)
Total Bilirubin: 0.6 mg/dL (ref 0.3–1.2)
Total Protein: 6.3 g/dL — ABNORMAL LOW (ref 6.5–8.1)

## 2021-03-18 LAB — BASIC METABOLIC PANEL
Anion gap: 6 (ref 5–15)
BUN: 12 mg/dL (ref 8–23)
CO2: 28 mmol/L (ref 22–32)
Calcium: 9.4 mg/dL (ref 8.9–10.3)
Chloride: 104 mmol/L (ref 98–111)
Creatinine, Ser: 1.14 mg/dL (ref 0.61–1.24)
GFR, Estimated: >60 mL/min (ref >60)
Glucose, Bld: 102 mg/dL — ABNORMAL HIGH (ref 70–99)
Potassium: 4.3 mmol/L (ref 3.5–5.1)
Sodium: 138 mmol/L (ref 135–145)

## 2021-03-18 LAB — LIPID PANEL
Cholesterol: 219 mg/dL — ABNORMAL HIGH (ref 0–200)
HDL: 64 mg/dL (ref >40)
LDL Cholesterol: 129 mg/dL — ABNORMAL HIGH (ref 0–99)
Total CHOL/HDL Ratio: 3.4 RATIO
Triglycerides: 130 mg/dL (ref <150)
VLDL: 26 mg/dL (ref 0–40)

## 2021-03-18 LAB — HEMOGLOBIN A1C
Hgb A1c MFr Bld: 5.7 % — ABNORMAL HIGH (ref 4.8–5.6)
Mean Plasma Glucose: 116.89 mg/dL

## 2021-04-05 ENCOUNTER — Other Ambulatory Visit: Payer: Self-pay

## 2021-04-05 ENCOUNTER — Ambulatory Visit: Payer: Medicare HMO | Admitting: Orthopaedic Surgery

## 2021-04-05 ENCOUNTER — Encounter: Payer: Self-pay | Admitting: Orthopaedic Surgery

## 2021-04-05 DIAGNOSIS — G8929 Other chronic pain: Secondary | ICD-10-CM | POA: Diagnosis not present

## 2021-04-05 DIAGNOSIS — M25511 Pain in right shoulder: Secondary | ICD-10-CM

## 2021-04-05 MED ORDER — HYDROCODONE-ACETAMINOPHEN 5-325 MG PO TABS: ORAL_TABLET | ORAL | 0 refills | Status: DC

## 2021-04-05 NOTE — Progress Notes (Signed)
My shoulder hurts some.  He recently had flu shot in the right shoulder and wants to hold off any injection today.  He has good ROM but pain when overhead use.  He has no numbness. He has no new trauma.  ROM of the right shoulder is full but has pain in the extremes.  NV intact. ROM of neck is full.  Grips good.  Encounter Diagnosis  Name Primary?   Chronic right shoulder pain Yes   I will refill pain medicine.  I have reviewed the [place] web site prior to prescribing narcotic medicine for this patient.  Return in six weeks.  Call if any problem.  Precautions discussed.  Electronically Signed Sanjuana Kava, MD 9/27/20229:37 AM

## 2021-04-14 DIAGNOSIS — M5136 Other intervertebral disc degeneration, lumbar region: Secondary | ICD-10-CM | POA: Diagnosis not present

## 2021-04-14 DIAGNOSIS — K219 Gastro-esophageal reflux disease without esophagitis: Secondary | ICD-10-CM | POA: Diagnosis not present

## 2021-05-15 DIAGNOSIS — E785 Hyperlipidemia, unspecified: Secondary | ICD-10-CM | POA: Diagnosis not present

## 2021-05-15 DIAGNOSIS — F17210 Nicotine dependence, cigarettes, uncomplicated: Secondary | ICD-10-CM | POA: Diagnosis not present

## 2021-05-17 ENCOUNTER — Encounter: Payer: Self-pay | Admitting: Orthopaedic Surgery

## 2021-05-17 ENCOUNTER — Ambulatory Visit: Payer: Medicare HMO | Admitting: Orthopaedic Surgery

## 2021-05-17 ENCOUNTER — Telehealth: Payer: Self-pay | Admitting: Orthopaedic Surgery

## 2021-05-17 NOTE — Telephone Encounter (Signed)
Letter to patient regarding today's missed appointment; phone # not going through

## 2021-05-31 DIAGNOSIS — Z1152 Encounter for screening for COVID-19: Secondary | ICD-10-CM | POA: Diagnosis not present

## 2021-06-14 DIAGNOSIS — K219 Gastro-esophageal reflux disease without esophagitis: Secondary | ICD-10-CM | POA: Diagnosis not present

## 2021-06-14 DIAGNOSIS — E785 Hyperlipidemia, unspecified: Secondary | ICD-10-CM | POA: Diagnosis not present

## 2021-06-22 ENCOUNTER — Telehealth: Payer: Self-pay

## 2021-06-22 MED ORDER — HYDROCODONE-ACETAMINOPHEN 5-325 MG PO TABS: ORAL_TABLET | ORAL | 0 refills | Status: DC

## 2021-06-22 NOTE — Telephone Encounter (Signed)
Hydrocodone-Acetaminophen 5/325 mg  Qty 22 Tablets  PATIENT USES [facility]

## 2021-06-28 ENCOUNTER — Ambulatory Visit: Payer: Medicare HMO | Admitting: Orthopaedic Surgery

## 2021-06-28 ENCOUNTER — Other Ambulatory Visit: Payer: Self-pay

## 2021-06-28 ENCOUNTER — Encounter: Payer: Self-pay | Admitting: Orthopaedic Surgery

## 2021-06-28 ENCOUNTER — Ambulatory Visit (INDEPENDENT_AMBULATORY_CARE_PROVIDER_SITE_OTHER): Payer: Medicare HMO | Admitting: Orthopaedic Surgery

## 2021-06-28 DIAGNOSIS — M25511 Pain in right shoulder: Secondary | ICD-10-CM | POA: Diagnosis not present

## 2021-06-28 DIAGNOSIS — G8929 Other chronic pain: Secondary | ICD-10-CM

## 2021-06-28 NOTE — Progress Notes (Signed)
PROCEDURE NOTE:  The patient request injection, verbal consent was obtained.  The right shoulder was prepped appropriately after time out was performed.   Sterile technique was observed and injection of 1 cc of DepoMedrol 40mg  with several cc's of plain xylocaine. Anesthesia was provided by ethyl chloride and a 20-gauge needle was used to inject the shoulder area. A posterior approach was used.  The injection was tolerated well.  A band aid dressing was applied.  The patient was advised to apply ice later today and tomorrow to the injection sight as needed.   Encounter Diagnosis  Name Primary?   Chronic right shoulder pain Yes   Return in six weeks.  Call if any problem.  Precautions discussed.  Electronically Signed Sanjuana Kava, MD 12/20/20221:53 PM

## 2021-06-30 ENCOUNTER — Telehealth: Payer: Self-pay | Admitting: Orthopaedic Surgery

## 2021-06-30 DIAGNOSIS — Z1152 Encounter for screening for COVID-19: Secondary | ICD-10-CM | POA: Diagnosis not present

## 2021-06-30 MED ORDER — HYDROCODONE-ACETAMINOPHEN 5-325 MG PO TABS: ORAL_TABLET | ORAL | 0 refills | Status: DC

## 2021-06-30 NOTE — Telephone Encounter (Signed)
Patient called requesting refill for his pain medicine.   HYDROcodone-acetaminophen (NORCO/VICODIN) 5-325 MG tablet  PHARMACY:  [facility]

## 2021-07-08 DIAGNOSIS — Z1152 Encounter for screening for COVID-19: Secondary | ICD-10-CM | POA: Diagnosis not present

## 2021-08-01 ENCOUNTER — Telehealth: Payer: Self-pay | Admitting: Orthopaedic Surgery

## 2021-08-01 MED ORDER — HYDROCODONE-ACETAMINOPHEN 5-325 MG PO TABS: ORAL_TABLET | ORAL | 0 refills | Status: DC

## 2021-08-01 NOTE — Telephone Encounter (Signed)
Patient needs refill on his pain medicine   HYDROcodone-acetaminophen (NORCO/VICODIN) 5-325 MG tablet  Pharmacy:  [facility]

## 2021-08-01 NOTE — Telephone Encounter (Signed)
Patient called at 9:00 am left voicemail stating he needs a refill on his medicine.  He did not leave a good contact number.   I tried the phone number on the account and it states " sorry your call can not be completed at this time. "

## 2021-08-09 ENCOUNTER — Ambulatory Visit: Payer: Medicare HMO | Admitting: Orthopaedic Surgery

## 2021-09-06 ENCOUNTER — Other Ambulatory Visit: Payer: Self-pay | Admitting: Orthopaedic Surgery

## 2021-09-06 MED ORDER — HYDROCODONE-ACETAMINOPHEN 5-325 MG PO TABS: ORAL_TABLET | ORAL | 0 refills | Status: DC

## 2021-09-06 NOTE — Telephone Encounter (Signed)
Patient requesting refill for his pain medicine.   HYDROcodone-acetaminophen (NORCO/VICODIN) 5-325 MG tablet   Pharmacy:  [facility]

## 2021-09-08 DIAGNOSIS — Z1389 Encounter for screening for other disorder: Secondary | ICD-10-CM | POA: Diagnosis not present

## 2021-09-08 DIAGNOSIS — K219 Gastro-esophageal reflux disease without esophagitis: Secondary | ICD-10-CM | POA: Diagnosis not present

## 2021-09-08 DIAGNOSIS — Z0001 Encounter for general adult medical examination with abnormal findings: Secondary | ICD-10-CM | POA: Diagnosis not present

## 2021-09-08 DIAGNOSIS — E785 Hyperlipidemia, unspecified: Secondary | ICD-10-CM | POA: Diagnosis not present

## 2021-09-08 DIAGNOSIS — Z1331 Encounter for screening for depression: Secondary | ICD-10-CM | POA: Diagnosis not present

## 2021-09-08 DIAGNOSIS — M7581 Other shoulder lesions, right shoulder: Secondary | ICD-10-CM | POA: Diagnosis not present

## 2021-09-08 DIAGNOSIS — F17210 Nicotine dependence, cigarettes, uncomplicated: Secondary | ICD-10-CM | POA: Diagnosis not present

## 2021-09-13 ENCOUNTER — Other Ambulatory Visit (HOSPITAL_COMMUNITY)
Admission: RE | Admit: 2021-09-13 | Discharge: 2021-09-13 | Disposition: A | Discharge status: Home / Self Care | Payer: Medicare HMO | Source: Ambulatory Visit | Attending: Internal Medicine | Admitting: Internal Medicine

## 2021-09-13 ENCOUNTER — Ambulatory Visit (HOSPITAL_COMMUNITY)
Admission: RE | Admit: 2021-09-13 | Discharge: 2021-09-13 | Disposition: A | Discharge status: Home / Self Care | Payer: Medicare HMO | Source: Ambulatory Visit | Attending: Internal Medicine | Admitting: Internal Medicine

## 2021-09-13 ENCOUNTER — Ambulatory Visit (INDEPENDENT_AMBULATORY_CARE_PROVIDER_SITE_OTHER): Payer: Medicare HMO | Admitting: Orthopaedic Surgery

## 2021-09-13 ENCOUNTER — Other Ambulatory Visit: Payer: Self-pay

## 2021-09-13 ENCOUNTER — Encounter: Payer: Self-pay | Admitting: Orthopaedic Surgery

## 2021-09-13 ENCOUNTER — Other Ambulatory Visit (HOSPITAL_COMMUNITY): Payer: Self-pay | Admitting: Internal Medicine

## 2021-09-13 VITALS — Ht 73.0 in | Wt 132.0 lb

## 2021-09-13 DIAGNOSIS — Z87891 Personal history of nicotine dependence: Secondary | ICD-10-CM | POA: Diagnosis not present

## 2021-09-13 DIAGNOSIS — G8929 Other chronic pain: Secondary | ICD-10-CM | POA: Diagnosis not present

## 2021-09-13 DIAGNOSIS — Z0001 Encounter for general adult medical examination with abnormal findings: Secondary | ICD-10-CM | POA: Insufficient documentation

## 2021-09-13 DIAGNOSIS — E785 Hyperlipidemia, unspecified: Secondary | ICD-10-CM | POA: Insufficient documentation

## 2021-09-13 DIAGNOSIS — K219 Gastro-esophageal reflux disease without esophagitis: Secondary | ICD-10-CM | POA: Diagnosis not present

## 2021-09-13 DIAGNOSIS — Z79899 Other long term (current) drug therapy: Secondary | ICD-10-CM | POA: Diagnosis not present

## 2021-09-13 DIAGNOSIS — J45909 Unspecified asthma, uncomplicated: Secondary | ICD-10-CM | POA: Diagnosis not present

## 2021-09-13 DIAGNOSIS — M25511 Pain in right shoulder: Secondary | ICD-10-CM

## 2021-09-13 DIAGNOSIS — R059 Cough, unspecified: Secondary | ICD-10-CM

## 2021-09-13 DIAGNOSIS — R0989 Other specified symptoms and signs involving the circulatory and respiratory systems: Secondary | ICD-10-CM | POA: Diagnosis not present

## 2021-09-13 LAB — CBC WITH DIFFERENTIAL/PLATELET
Abs Immature Granulocytes: 0.01 10*3/uL (ref 0.00–0.07)
Basophils Absolute: 0 10*3/uL (ref 0.0–0.1)
Basophils Relative: 1 %
Eosinophils Absolute: 0 10*3/uL (ref 0.0–0.5)
Eosinophils Relative: 1 %
HCT: 37.6 % — ABNORMAL LOW (ref 39.0–52.0)
Hemoglobin: 12.3 g/dL — ABNORMAL LOW (ref 13.0–17.0)
Immature Granulocytes: 0 %
Lymphocytes Relative: 13 %
Lymphs Abs: 0.5 10*3/uL — ABNORMAL LOW (ref 0.7–4.0)
MCH: 29.7 pg (ref 26.0–34.0)
MCHC: 32.7 g/dL (ref 30.0–36.0)
MCV: 90.8 fL (ref 80.0–100.0)
Monocytes Absolute: 0.1 10*3/uL (ref 0.1–1.0)
Monocytes Relative: 3 %
Neutro Abs: 3 10*3/uL (ref 1.7–7.7)
Neutrophils Relative %: 82 %
Platelets: 242 10*3/uL (ref 150–400)
RBC: 4.14 MIL/uL — ABNORMAL LOW (ref 4.22–5.81)
RDW: 13.4 % (ref 11.5–15.5)
WBC: 3.6 10*3/uL — ABNORMAL LOW (ref 4.0–10.5)
nRBC: 0 % (ref 0.0–0.2)

## 2021-09-13 LAB — LIPID PANEL
Cholesterol: 201 mg/dL — ABNORMAL HIGH (ref 0–200)
HDL: 64 mg/dL (ref >40)
LDL Cholesterol: 109 mg/dL — ABNORMAL HIGH (ref 0–99)
Total CHOL/HDL Ratio: 3.1 RATIO
Triglycerides: 139 mg/dL (ref <150)
VLDL: 28 mg/dL (ref 0–40)

## 2021-09-13 LAB — BASIC METABOLIC PANEL
Anion gap: 8 (ref 5–15)
BUN: 16 mg/dL (ref 8–23)
CO2: 25 mmol/L (ref 22–32)
Calcium: 9.1 mg/dL (ref 8.9–10.3)
Chloride: 109 mmol/L (ref 98–111)
Creatinine, Ser: 1.13 mg/dL (ref 0.61–1.24)
GFR, Estimated: >60 mL/min (ref >60)
Glucose, Bld: 95 mg/dL (ref 70–99)
Potassium: 4.1 mmol/L (ref 3.5–5.1)
Sodium: 142 mmol/L (ref 135–145)

## 2021-09-13 LAB — HEPATIC FUNCTION PANEL
ALT: 16 U/L (ref 0–44)
AST: 22 U/L (ref 15–41)
Albumin: 3.8 g/dL (ref 3.5–5.0)
Alkaline Phosphatase: 45 U/L (ref 38–126)
Bilirubin, Direct: <0.1 mg/dL (ref 0.0–0.2)
Total Bilirubin: 0.4 mg/dL (ref 0.3–1.2)
Total Protein: 6.4 g/dL — ABNORMAL LOW (ref 6.5–8.1)

## 2021-09-13 NOTE — Progress Notes (Signed)
My shoulder is much better. ? ?He has little pain in the right shoulder now.  He has no new trauma. ? ?He has full ROM of the right shoulder, no pain, NV intact.  ROM of neck is full.  Grips normal. ? ?Encounter Diagnosis  ?Name Primary?  ? Chronic right shoulder pain Yes  ? ?I will see as needed. ? ?Call if any problem. ? ?Precautions discussed. ? ?Electronically Signed ?Sanjuana Kava, MD ?3/7/202310:02 AM ? ?

## 2021-09-26 ENCOUNTER — Telehealth: Payer: Self-pay | Admitting: Orthopaedic Surgery

## 2021-09-26 NOTE — Telephone Encounter (Signed)
Patient requests refill: ?HYDROcodone-acetaminophen (NORCO/VICODIN) 5-325 MG tablet 22 tablet  ?            [place] ?

## 2021-09-29 MED ORDER — HYDROCODONE-ACETAMINOPHEN 5-325 MG PO TABS: ORAL_TABLET | ORAL | 0 refills | Status: DC

## 2021-10-05 DIAGNOSIS — Z1152 Encounter for screening for COVID-19: Secondary | ICD-10-CM | POA: Diagnosis not present

## 2021-10-09 DIAGNOSIS — M5136 Other intervertebral disc degeneration, lumbar region: Secondary | ICD-10-CM | POA: Diagnosis not present

## 2021-10-09 DIAGNOSIS — F17210 Nicotine dependence, cigarettes, uncomplicated: Secondary | ICD-10-CM | POA: Diagnosis not present

## 2021-10-11 DIAGNOSIS — M5126 Other intervertebral disc displacement, lumbar region: Secondary | ICD-10-CM | POA: Diagnosis not present

## 2021-10-11 DIAGNOSIS — G894 Chronic pain syndrome: Secondary | ICD-10-CM | POA: Diagnosis not present

## 2021-10-15 DIAGNOSIS — Z1152 Encounter for screening for COVID-19: Secondary | ICD-10-CM | POA: Diagnosis not present

## 2021-10-20 ENCOUNTER — Telehealth: Payer: Self-pay | Admitting: Orthopaedic Surgery

## 2021-10-20 DIAGNOSIS — Z1152 Encounter for screening for COVID-19: Secondary | ICD-10-CM | POA: Diagnosis not present

## 2021-10-20 MED ORDER — HYDROCODONE-ACETAMINOPHEN 5-325 MG PO TABS: ORAL_TABLET | ORAL | 0 refills | Status: DC

## 2021-10-20 NOTE — Telephone Encounter (Signed)
Patient request a refill on his pain medicine  ? ?HYDROcodone-acetaminophen (NORCO/VICODIN) 5-325 MG tablet ? ? ?Pharmacy: [facility]  ?

## 2021-10-25 ENCOUNTER — Ambulatory Visit: Payer: Medicare HMO | Admitting: Orthopaedic Surgery

## 2021-11-02 DIAGNOSIS — Z1152 Encounter for screening for COVID-19: Secondary | ICD-10-CM | POA: Diagnosis not present

## 2021-11-09 DIAGNOSIS — Z1152 Encounter for screening for COVID-19: Secondary | ICD-10-CM | POA: Diagnosis not present

## 2021-11-10 DIAGNOSIS — F17210 Nicotine dependence, cigarettes, uncomplicated: Secondary | ICD-10-CM | POA: Diagnosis not present

## 2021-11-10 DIAGNOSIS — K219 Gastro-esophageal reflux disease without esophagitis: Secondary | ICD-10-CM | POA: Diagnosis not present

## 2021-11-21 ENCOUNTER — Telehealth: Payer: Self-pay | Admitting: Orthopaedic Surgery

## 2021-11-21 NOTE — Telephone Encounter (Signed)
Patient called to relay that he is out of pain medication and requests refill. Also aware Dr Luna Glasgow is out of clinic until week 12/12/21.  Per review of chart by clinic staff, patient missed his last scheduled appointment in April with  ?Dr Luna Glasgow; therefore, needs to be seen prior to refills per policy. I've called back to let patient know, and none of the 3 phone numbers I have tried are going through; unable to leave message. ?

## 2021-11-22 ENCOUNTER — Other Ambulatory Visit: Payer: Self-pay | Admitting: Orthopaedic Surgery

## 2021-11-22 NOTE — Telephone Encounter (Signed)
Patient called back (refer to phone note of 11/21/21) - patient updated his contact phone numbers. Patient is aware while Dr Luna Glasgow is out of office until 12/13/21, that our other providers are reviewing requests. Patient also aware of importance of keeping scheduled appointments, and has rescheduled his missed appointment with Dr Luna Glasgow from 10/25/21 to 12/15/21. Please advise regarding  ?Refill of  ?HYDROcodone-acetaminophen (NORCO/VICODIN) 5-325 MG tablet 22 tablet  ?     [place] ?

## 2021-11-22 NOTE — Telephone Encounter (Signed)
Patient called back from (corrected phone#) [phone], and another alternate cell [phone]. Resending request for medication; patient also rescheduled appointment with Dr Luna Glasgow. ?

## 2021-11-23 MED ORDER — HYDROCODONE-ACETAMINOPHEN 5-325 MG PO TABS: ORAL_TABLET | ORAL | 0 refills | Status: AC

## 2021-11-29 DIAGNOSIS — W57XXXS Bitten or stung by nonvenomous insect and other nonvenomous arthropods, sequela: Secondary | ICD-10-CM | POA: Diagnosis not present

## 2021-11-29 DIAGNOSIS — J4530 Mild persistent asthma, uncomplicated: Secondary | ICD-10-CM | POA: Diagnosis not present

## 2021-11-29 DIAGNOSIS — K219 Gastro-esophageal reflux disease without esophagitis: Secondary | ICD-10-CM | POA: Diagnosis not present

## 2021-11-29 DIAGNOSIS — M5136 Other intervertebral disc degeneration, lumbar region: Secondary | ICD-10-CM | POA: Diagnosis not present

## 2021-12-15 ENCOUNTER — Ambulatory Visit: Payer: Medicare HMO | Admitting: Orthopaedic Surgery

## 2021-12-30 DIAGNOSIS — F17210 Nicotine dependence, cigarettes, uncomplicated: Secondary | ICD-10-CM | POA: Diagnosis not present

## 2021-12-30 DIAGNOSIS — E785 Hyperlipidemia, unspecified: Secondary | ICD-10-CM | POA: Diagnosis not present

## 2022-01-29 DIAGNOSIS — M549 Dorsalgia, unspecified: Secondary | ICD-10-CM | POA: Diagnosis not present

## 2022-01-29 DIAGNOSIS — F17210 Nicotine dependence, cigarettes, uncomplicated: Secondary | ICD-10-CM | POA: Diagnosis not present

## 2022-02-07 DIAGNOSIS — R197 Diarrhea, unspecified: Secondary | ICD-10-CM | POA: Diagnosis not present

## 2022-02-07 DIAGNOSIS — K219 Gastro-esophageal reflux disease without esophagitis: Secondary | ICD-10-CM | POA: Diagnosis not present

## 2022-03-10 DIAGNOSIS — J4530 Mild persistent asthma, uncomplicated: Secondary | ICD-10-CM | POA: Diagnosis not present

## 2022-03-10 DIAGNOSIS — M5136 Other intervertebral disc degeneration, lumbar region: Secondary | ICD-10-CM | POA: Diagnosis not present

## 2022-04-05 ENCOUNTER — Other Ambulatory Visit: Payer: Self-pay

## 2022-04-05 ENCOUNTER — Ambulatory Visit
Admission: EM | Admit: 2022-04-05 | Discharge: 2022-04-05 | Disposition: A | Discharge status: Home / Self Care | Payer: Medicare HMO | Attending: Family Medicine | Admitting: Family Medicine

## 2022-04-05 ENCOUNTER — Encounter: Payer: Self-pay | Admitting: Emergency Medicine

## 2022-04-05 DIAGNOSIS — Z20822 Contact with and (suspected) exposure to covid-19: Secondary | ICD-10-CM | POA: Diagnosis not present

## 2022-04-05 DIAGNOSIS — J069 Acute upper respiratory infection, unspecified: Secondary | ICD-10-CM | POA: Diagnosis not present

## 2022-04-05 DIAGNOSIS — J4521 Mild intermittent asthma with (acute) exacerbation: Secondary | ICD-10-CM | POA: Insufficient documentation

## 2022-04-05 DIAGNOSIS — J3089 Other allergic rhinitis: Secondary | ICD-10-CM | POA: Insufficient documentation

## 2022-04-05 LAB — RESP PANEL BY RT-PCR (FLU A&B, COVID) ARPGX2
Influenza A by PCR: NEGATIVE
Influenza B by PCR: NEGATIVE
SARS Coronavirus 2 by RT PCR: NEGATIVE

## 2022-04-05 MED ORDER — CETIRIZINE HCL 10 MG PO TABS: 10.0000 mg | ORAL_TABLET | Freq: Every day | ORAL | 2 refills | Status: AC

## 2022-04-05 MED ORDER — ALBUTEROL SULFATE HFA 108 (90 BASE) MCG/ACT IN AERS: 2.0000 | INHALATION_SPRAY | RESPIRATORY_TRACT | 0 refills | Status: DC | PRN

## 2022-04-05 MED ORDER — PROMETHAZINE-DM 6.25-15 MG/5ML PO SYRP: 5.0000 mL | ORAL_SOLUTION | Freq: Four times a day (QID) | ORAL | 0 refills | Status: AC | PRN

## 2022-04-05 MED ORDER — ALBUTEROL SULFATE HFA 108 (90 BASE) MCG/ACT IN AERS: 2.0000 | INHALATION_SPRAY | RESPIRATORY_TRACT | 0 refills | Status: AC | PRN

## 2022-04-05 NOTE — ED Provider Notes (Signed)
[facility]    CSN: [id number] Arrival date & time: 04/05/22  1703      History   Chief Complaint Chief Complaint  Patient presents with   Cough    HPI Charles Rivers is a 71 y.o. male.   Patient presenting today with 1 week history of cough, congestion, wheezing, chest tightness.  So far not taking anything over-the-counter for symptoms other than his albuterol inhaler for his asthma which he states helps somewhat but he needs a refill.  He denies fever, chills, body aches, chest pain, shortness of breath.  Had a COVID exposure several days ago so wanting to be tested for COVID additionally.    Past Medical History:  Diagnosis Date   Asthma    Chronic back pain    Hyperlipidemia     Patient Active Problem List   Diagnosis Date Noted   GERD (gastroesophageal reflux disease) 10/31/2013   Nausea 10/31/2013   Nail fungus 10/01/2013   Hyperlipidemia 01/08/2013   Bulging lumbar disc 01/08/2013   Positive fecal occult blood test 01/08/2013   Marijuana abuse 06/21/2012   Lipoma 08/22/2011   Dyspepsia 04/30/2011   Asthma 02/15/2011   Back pain with radiation 02/15/2011   Erectile dysfunction 02/15/2011    Past Surgical History:  Procedure Laterality Date   Right middle finger reconstruction         Home Medications    Prior to Admission medications   Medication Sig Start Date End Date Taking? Authorizing Provider  cetirizine (ZYRTEC ALLERGY) 10 MG tablet Take 1 tablet (10 mg total) by mouth daily. 04/05/22  Yes Volney American, PA-C  promethazine-dextromethorphan (PROMETHAZINE-DM) 6.25-15 MG/5ML syrup Take 5 mLs by mouth 4 (four) times daily as needed. 04/05/22  Yes Volney American, PA-C  albuterol (VENTOLIN HFA) 108 (90 Base) MCG/ACT inhaler Inhale 2 puffs into the lungs every 4 (four) hours as needed for wheezing or shortness of breath. 04/05/22   Volney American, PA-C  bismuth subsalicylate (PEPTO BISMOL) 262 MG/15ML suspension  Take 30 mLs by mouth daily as needed for indigestion.    [provider]  HYDROcodone-acetaminophen (NORCO/VICODIN) 5-325 MG tablet One tablet every six hours for pain as needed. 11/23/21   Carole Civil, MD  naproxen (NAPROSYN) 500 MG tablet Take 1 tablet (500 mg total) by mouth 2 (two) times daily with a meal. 11/04/20   Sanjuana Kava, MD  ondansetron (ZOFRAN) 4 MG tablet Take 1 tablet (4 mg total) by mouth every 6 (six) hours. 05/07/20   Lestine Box, PA-C    Family History Family History  Problem Relation Age of Onset   Diabetes Mother    Cancer Mother        breast   Cancer Father    Asthma Brother     Social History Social History   Tobacco Use   Smoking status: Former    Types: Cigarettes    Quit date: 07/27/1967    Years since quitting: 54.7   Smokeless tobacco: Never  Vaping Use   Vaping Use: Never used  Substance Use Topics   Alcohol use: Yes    Comment: 2-3 beers a week   Drug use: Yes    Types: Marijuana    Comment: marajuana many years ago     Allergies   Patient has no known allergies.   Review of Systems Review of Systems Per HPI  Physical Exam Triage Vital Signs ED Triage Vitals [04/05/22 1812]  Enc Vitals Group  BP 117/79     Pulse Rate 62     Resp 20     Temp 98.9 F (37.2 C)     Temp Source Oral     SpO2 97 %     Weight      Height      Head Circumference      Peak Flow      Pain Score 0     Pain Loc      Pain Edu?      Excl. in [place]?    No data found.  Updated Vital Signs BP 117/79 (BP Location: Right Arm)   Pulse 62   Temp 98.9 F (37.2 C) (Oral)   Resp 20   SpO2 97%   Visual Acuity Right Eye Distance:   Left Eye Distance:   Bilateral Distance:    Right Eye Near:   Left Eye Near:    Bilateral Near:     Physical Exam Vitals and nursing note reviewed.  Constitutional:      Appearance: He is well-developed.  HENT:     Head: Atraumatic.     Right Ear: External ear normal.     Left Ear: External  ear normal.     Nose: Rhinorrhea present.     Mouth/Throat:     Pharynx: Posterior oropharyngeal erythema present. No oropharyngeal exudate.  Eyes:     Conjunctiva/sclera: Conjunctivae normal.     Pupils: Pupils are equal, round, and reactive to light.  Cardiovascular:     Rate and Rhythm: Normal rate and regular rhythm.  Pulmonary:     Effort: Pulmonary effort is normal. No respiratory distress.     Breath sounds: Wheezing present. No rales.  Musculoskeletal:        General: Normal range of motion.     Cervical back: Normal range of motion and neck supple.  Lymphadenopathy:     Cervical: No cervical adenopathy.  Skin:    General: Skin is warm and dry.  Neurological:     Mental Status: He is alert and oriented to person, place, and time.  Psychiatric:        Behavior: Behavior normal.      UC Treatments / Results  Labs (all labs ordered are listed, but only abnormal results are displayed) Labs Reviewed  RESP PANEL BY RT-PCR (FLU A&B, COVID) [id number]    EKG   Radiology No results found.  Procedures Procedures (including critical care time)  Medications Ordered in UC Medications - No data to display  Initial Impression / Assessment and Plan / UC Course  I have reviewed the triage vital signs and the nursing notes.  Pertinent labs & imaging results that were available during my care of the patient were reviewed by me and considered in my medical decision making (see chart for details).     We will start allergy regimen of Zyrtec, recommended Flonase and refill albuterol for asthma exacerbation secondary to possible viral upper respiratory infection.  Respiratory panel pending given recent COVID exposure though his symptoms seem to have started just prior to this exposure.  Discussed over-the-counter cold and congestion medications, Phenergan DM and return precautions.  Final Clinical Impressions(s) / UC Diagnoses   Final diagnoses:  Exposure to COVID-19 virus   Viral URI with cough  Mild intermittent asthma with acute exacerbation  Seasonal allergic rhinitis due to other allergic trigger   Discharge Instructions   None    ED Prescriptions     Medication Sig Dispense Auth.  Provider   albuterol (VENTOLIN HFA) 108 (90 Base) MCG/ACT inhaler  (Status: Discontinued) Inhale 2 puffs into the lungs every 4 (four) hours as needed for wheezing or shortness of breath. 1 each Volney American, PA-C   promethazine-dextromethorphan (PROMETHAZINE-DM) 6.25-15 MG/5ML syrup Take 5 mLs by mouth 4 (four) times daily as needed. 100 mL Volney American, PA-C   cetirizine (ZYRTEC ALLERGY) 10 MG tablet Take 1 tablet (10 mg total) by mouth daily. 30 tablet Volney American, [place]   albuterol (VENTOLIN HFA) 108 (90 Base) MCG/ACT inhaler Inhale 2 puffs into the lungs every 4 (four) hours as needed for wheezing or shortness of breath. 1 each Volney American, PA-C      PDMP not reviewed this encounter.   Volney American, [place] 04/05/22 1902

## 2022-04-05 NOTE — ED Triage Notes (Signed)
Pt reports cough that is worse at night for last several days. Pt denies pain and reports recent covid exposure.

## 2022-04-09 DIAGNOSIS — K219 Gastro-esophageal reflux disease without esophagitis: Secondary | ICD-10-CM | POA: Diagnosis not present

## 2022-04-09 DIAGNOSIS — J4530 Mild persistent asthma, uncomplicated: Secondary | ICD-10-CM | POA: Diagnosis not present

## 2022-05-10 DIAGNOSIS — K219 Gastro-esophageal reflux disease without esophagitis: Secondary | ICD-10-CM | POA: Diagnosis not present

## 2022-05-10 DIAGNOSIS — E785 Hyperlipidemia, unspecified: Secondary | ICD-10-CM | POA: Diagnosis not present

## 2022-06-09 DIAGNOSIS — K219 Gastro-esophageal reflux disease without esophagitis: Secondary | ICD-10-CM | POA: Diagnosis not present

## 2022-06-09 DIAGNOSIS — E785 Hyperlipidemia, unspecified: Secondary | ICD-10-CM | POA: Diagnosis not present

## 2022-07-05 DIAGNOSIS — M5136 Other intervertebral disc degeneration, lumbar region: Secondary | ICD-10-CM | POA: Diagnosis not present

## 2022-07-05 DIAGNOSIS — K219 Gastro-esophageal reflux disease without esophagitis: Secondary | ICD-10-CM | POA: Diagnosis not present

## 2022-07-05 DIAGNOSIS — B353 Tinea pedis: Secondary | ICD-10-CM | POA: Diagnosis not present

## 2022-07-05 DIAGNOSIS — J4530 Mild persistent asthma, uncomplicated: Secondary | ICD-10-CM | POA: Diagnosis not present

## 2022-08-02 ENCOUNTER — Other Ambulatory Visit: Payer: Self-pay | Admitting: Family Medicine

## 2022-08-02 NOTE — Telephone Encounter (Signed)
No longer under prescribers care  Requested Prescriptions  Refused Prescriptions Disp Refills   albuterol (VENTOLIN HFA) 108 (90 Base) MCG/ACT inhaler [Pharmacy Med Name: ALBUTEROL HFA INHALER 18GM] 18 g 0    Sig: INHALE 2 PUFFS EVERY 4 HOURS AS NEEDED.     There is no refill protocol information for this order

## 2022-08-05 DIAGNOSIS — E785 Hyperlipidemia, unspecified: Secondary | ICD-10-CM | POA: Diagnosis not present

## 2022-08-05 DIAGNOSIS — K219 Gastro-esophageal reflux disease without esophagitis: Secondary | ICD-10-CM | POA: Diagnosis not present

## 2022-09-05 DIAGNOSIS — K219 Gastro-esophageal reflux disease without esophagitis: Secondary | ICD-10-CM | POA: Diagnosis not present

## 2022-09-05 DIAGNOSIS — E785 Hyperlipidemia, unspecified: Secondary | ICD-10-CM | POA: Diagnosis not present

## 2022-09-25 DIAGNOSIS — K219 Gastro-esophageal reflux disease without esophagitis: Secondary | ICD-10-CM | POA: Diagnosis not present

## 2022-09-25 DIAGNOSIS — G63 Polyneuropathy in diseases classified elsewhere: Secondary | ICD-10-CM | POA: Diagnosis not present

## 2022-09-25 DIAGNOSIS — J4530 Mild persistent asthma, uncomplicated: Secondary | ICD-10-CM | POA: Diagnosis not present

## 2022-09-25 DIAGNOSIS — E785 Hyperlipidemia, unspecified: Secondary | ICD-10-CM | POA: Diagnosis not present

## 2022-09-25 DIAGNOSIS — G5690 Unspecified mononeuropathy of unspecified upper limb: Secondary | ICD-10-CM | POA: Diagnosis not present

## 2022-09-25 DIAGNOSIS — M25511 Pain in right shoulder: Secondary | ICD-10-CM | POA: Diagnosis not present

## 2022-10-30 DIAGNOSIS — K219 Gastro-esophageal reflux disease without esophagitis: Secondary | ICD-10-CM | POA: Diagnosis not present

## 2022-10-30 DIAGNOSIS — E785 Hyperlipidemia, unspecified: Secondary | ICD-10-CM | POA: Diagnosis not present

## 2022-11-29 DIAGNOSIS — E785 Hyperlipidemia, unspecified: Secondary | ICD-10-CM | POA: Diagnosis not present

## 2022-11-29 DIAGNOSIS — K219 Gastro-esophageal reflux disease without esophagitis: Secondary | ICD-10-CM | POA: Diagnosis not present

## 2022-12-30 DIAGNOSIS — K219 Gastro-esophageal reflux disease without esophagitis: Secondary | ICD-10-CM | POA: Diagnosis not present

## 2022-12-30 DIAGNOSIS — E785 Hyperlipidemia, unspecified: Secondary | ICD-10-CM | POA: Diagnosis not present

## 2023-01-29 DIAGNOSIS — K219 Gastro-esophageal reflux disease without esophagitis: Secondary | ICD-10-CM | POA: Diagnosis not present

## 2023-01-29 DIAGNOSIS — E785 Hyperlipidemia, unspecified: Secondary | ICD-10-CM | POA: Diagnosis not present

## 2023-03-01 DIAGNOSIS — E785 Hyperlipidemia, unspecified: Secondary | ICD-10-CM | POA: Diagnosis not present

## 2023-03-01 DIAGNOSIS — K219 Gastro-esophageal reflux disease without esophagitis: Secondary | ICD-10-CM | POA: Diagnosis not present

## 2023-04-01 DIAGNOSIS — E785 Hyperlipidemia, unspecified: Secondary | ICD-10-CM | POA: Diagnosis not present

## 2023-04-01 DIAGNOSIS — K219 Gastro-esophageal reflux disease without esophagitis: Secondary | ICD-10-CM | POA: Diagnosis not present

## 2023-04-30 DIAGNOSIS — I10 Essential (primary) hypertension: Secondary | ICD-10-CM | POA: Diagnosis not present

## 2023-04-30 DIAGNOSIS — J4530 Mild persistent asthma, uncomplicated: Secondary | ICD-10-CM | POA: Diagnosis not present

## 2023-04-30 DIAGNOSIS — K219 Gastro-esophageal reflux disease without esophagitis: Secondary | ICD-10-CM | POA: Diagnosis not present

## 2023-04-30 DIAGNOSIS — R052 Subacute cough: Secondary | ICD-10-CM | POA: Diagnosis not present

## 2023-05-31 DIAGNOSIS — K219 Gastro-esophageal reflux disease without esophagitis: Secondary | ICD-10-CM | POA: Diagnosis not present

## 2023-05-31 DIAGNOSIS — E785 Hyperlipidemia, unspecified: Secondary | ICD-10-CM | POA: Diagnosis not present

## 2023-06-30 DIAGNOSIS — E785 Hyperlipidemia, unspecified: Secondary | ICD-10-CM | POA: Diagnosis not present

## 2023-06-30 DIAGNOSIS — K219 Gastro-esophageal reflux disease without esophagitis: Secondary | ICD-10-CM | POA: Diagnosis not present

## 2023-07-31 DIAGNOSIS — K219 Gastro-esophageal reflux disease without esophagitis: Secondary | ICD-10-CM | POA: Diagnosis not present

## 2023-07-31 DIAGNOSIS — E785 Hyperlipidemia, unspecified: Secondary | ICD-10-CM | POA: Diagnosis not present

## 2023-08-31 DIAGNOSIS — E785 Hyperlipidemia, unspecified: Secondary | ICD-10-CM | POA: Diagnosis not present

## 2023-08-31 DIAGNOSIS — K219 Gastro-esophageal reflux disease without esophagitis: Secondary | ICD-10-CM | POA: Diagnosis not present

## 2023-09-21 DIAGNOSIS — Z0100 Encounter for examination of eyes and vision without abnormal findings: Secondary | ICD-10-CM | POA: Diagnosis not present

## 2023-09-28 DIAGNOSIS — E785 Hyperlipidemia, unspecified: Secondary | ICD-10-CM | POA: Diagnosis not present

## 2023-09-28 DIAGNOSIS — K219 Gastro-esophageal reflux disease without esophagitis: Secondary | ICD-10-CM | POA: Diagnosis not present

## 2023-11-07 DIAGNOSIS — K219 Gastro-esophageal reflux disease without esophagitis: Secondary | ICD-10-CM | POA: Diagnosis not present

## 2023-11-07 DIAGNOSIS — E785 Hyperlipidemia, unspecified: Secondary | ICD-10-CM | POA: Diagnosis not present

## 2023-11-15 DIAGNOSIS — M5030 Other cervical disc degeneration, unspecified cervical region: Secondary | ICD-10-CM | POA: Diagnosis not present

## 2023-11-15 DIAGNOSIS — Z1389 Encounter for screening for other disorder: Secondary | ICD-10-CM | POA: Diagnosis not present

## 2023-11-15 DIAGNOSIS — I10 Essential (primary) hypertension: Secondary | ICD-10-CM | POA: Diagnosis not present

## 2023-11-15 DIAGNOSIS — Z23 Encounter for immunization: Secondary | ICD-10-CM | POA: Diagnosis not present

## 2023-11-15 DIAGNOSIS — Z0001 Encounter for general adult medical examination with abnormal findings: Secondary | ICD-10-CM | POA: Diagnosis not present

## 2023-11-15 DIAGNOSIS — J4530 Mild persistent asthma, uncomplicated: Secondary | ICD-10-CM | POA: Diagnosis not present

## 2023-11-15 DIAGNOSIS — Z1331 Encounter for screening for depression: Secondary | ICD-10-CM | POA: Diagnosis not present

## 2023-11-15 DIAGNOSIS — G5690 Unspecified mononeuropathy of unspecified upper limb: Secondary | ICD-10-CM | POA: Diagnosis not present

## 2023-11-15 DIAGNOSIS — E785 Hyperlipidemia, unspecified: Secondary | ICD-10-CM | POA: Diagnosis not present

## 2023-12-16 DIAGNOSIS — E785 Hyperlipidemia, unspecified: Secondary | ICD-10-CM | POA: Diagnosis not present

## 2023-12-16 DIAGNOSIS — K219 Gastro-esophageal reflux disease without esophagitis: Secondary | ICD-10-CM | POA: Diagnosis not present

## 2024-01-15 DIAGNOSIS — E785 Hyperlipidemia, unspecified: Secondary | ICD-10-CM | POA: Diagnosis not present

## 2024-01-15 DIAGNOSIS — K219 Gastro-esophageal reflux disease without esophagitis: Secondary | ICD-10-CM | POA: Diagnosis not present

## 2024-02-15 DIAGNOSIS — K219 Gastro-esophageal reflux disease without esophagitis: Secondary | ICD-10-CM | POA: Diagnosis not present

## 2024-02-15 DIAGNOSIS — E785 Hyperlipidemia, unspecified: Secondary | ICD-10-CM | POA: Diagnosis not present

## 2024-03-17 DIAGNOSIS — E785 Hyperlipidemia, unspecified: Secondary | ICD-10-CM | POA: Diagnosis not present

## 2024-03-17 DIAGNOSIS — K219 Gastro-esophageal reflux disease without esophagitis: Secondary | ICD-10-CM | POA: Diagnosis not present

## 2024-04-16 DIAGNOSIS — K219 Gastro-esophageal reflux disease without esophagitis: Secondary | ICD-10-CM | POA: Diagnosis not present

## 2024-04-16 DIAGNOSIS — E785 Hyperlipidemia, unspecified: Secondary | ICD-10-CM | POA: Diagnosis not present

## 2024-07-21 ENCOUNTER — Other Ambulatory Visit (HOSPITAL_COMMUNITY)
Admission: RE | Admit: 2024-07-21 | Discharge: 2024-07-21 | Disposition: A | Discharge status: Home / Self Care | Source: Ambulatory Visit | Attending: Internal Medicine | Admitting: Internal Medicine

## 2024-07-21 DIAGNOSIS — I10 Essential (primary) hypertension: Secondary | ICD-10-CM | POA: Insufficient documentation

## 2024-07-21 LAB — BASIC METABOLIC PANEL WITH GFR
Anion gap: 12 (ref 5–15)
BUN: 13 mg/dL (ref 8–23)
CO2: 22 mmol/L (ref 22–32)
Calcium: 9.5 mg/dL (ref 8.9–10.3)
Chloride: 105 mmol/L (ref 98–111)
Creatinine, Ser: 0.93 mg/dL (ref 0.61–1.24)
GFR, Estimated: >60 mL/min
Glucose, Bld: 95 mg/dL (ref 70–99)
Potassium: 4.2 mmol/L (ref 3.5–5.1)
Sodium: 140 mmol/L (ref 135–145)
